# Patient Record
Sex: Female | Born: 1985 | Race: White | Hispanic: No | Marital: Single | Smoking: Former smoker
Health system: Southern US, Community
[De-identification: ages and names within clinical notes are randomized; demographics above are authoritative.]

## PROBLEM LIST (undated history)

## (undated) ENCOUNTER — Inpatient Hospital Stay (HOSPITAL_COMMUNITY): Payer: Self-pay

## (undated) DIAGNOSIS — O24419 Gestational diabetes mellitus in pregnancy, unspecified control: Secondary | ICD-10-CM

## (undated) DIAGNOSIS — B9689 Other specified bacterial agents as the cause of diseases classified elsewhere: Secondary | ICD-10-CM

## (undated) DIAGNOSIS — N760 Acute vaginitis: Secondary | ICD-10-CM

## (undated) DIAGNOSIS — D649 Anemia, unspecified: Secondary | ICD-10-CM

## (undated) DIAGNOSIS — I10 Essential (primary) hypertension: Secondary | ICD-10-CM

## (undated) HISTORY — DX: Other specified bacterial agents as the cause of diseases classified elsewhere: B96.89

## (undated) HISTORY — DX: Other specified bacterial agents as the cause of diseases classified elsewhere: N76.0

## (undated) HISTORY — PX: MOUTH SURGERY: SHX715

## (undated) HISTORY — DX: Essential (primary) hypertension: I10

---
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
## 2006-04-09 ENCOUNTER — Inpatient Hospital Stay (HOSPITAL_COMMUNITY): Admission: AD | Admit: 2006-04-09 | Discharge: 2006-04-09 | Payer: Self-pay | Admitting: *Deleted

## 2006-05-07 ENCOUNTER — Inpatient Hospital Stay (HOSPITAL_COMMUNITY): Admission: AD | Admit: 2006-05-07 | Discharge: 2006-05-07 | Payer: Self-pay | Admitting: Obstetrics & Gynecology

## 2006-06-02 ENCOUNTER — Other Ambulatory Visit: Admission: RE | Admit: 2006-06-02 | Discharge: 2006-06-02 | Payer: Self-pay | Admitting: Obstetrics and Gynecology

## 2006-10-24 ENCOUNTER — Inpatient Hospital Stay (HOSPITAL_COMMUNITY): Admission: AD | Admit: 2006-10-24 | Discharge: 2006-10-24 | Payer: Self-pay | Admitting: Obstetrics and Gynecology

## 2006-10-28 ENCOUNTER — Inpatient Hospital Stay (HOSPITAL_COMMUNITY): Admission: AD | Admit: 2006-10-28 | Discharge: 2006-10-30 | Payer: Self-pay | Admitting: Obstetrics and Gynecology

## 2007-02-19 ENCOUNTER — Emergency Department (HOSPITAL_COMMUNITY): Admission: EM | Admit: 2007-02-19 | Discharge: 2007-02-19 | Payer: Self-pay | Admitting: Emergency Medicine

## 2007-06-03 IMAGING — US US OB COMP +14 WK
1 series · 13 of 28 positions shown · non-contrast
Comparison: none

CLINICAL DATA: 15 week 4 day gestational age by LMP.  Abdominal pain with nausea and vomiting.  Headaches.

[Series 1: us ob comp +14 wk · 0.29mm/px · 13 of 32 slices shown]
[im 2/32]
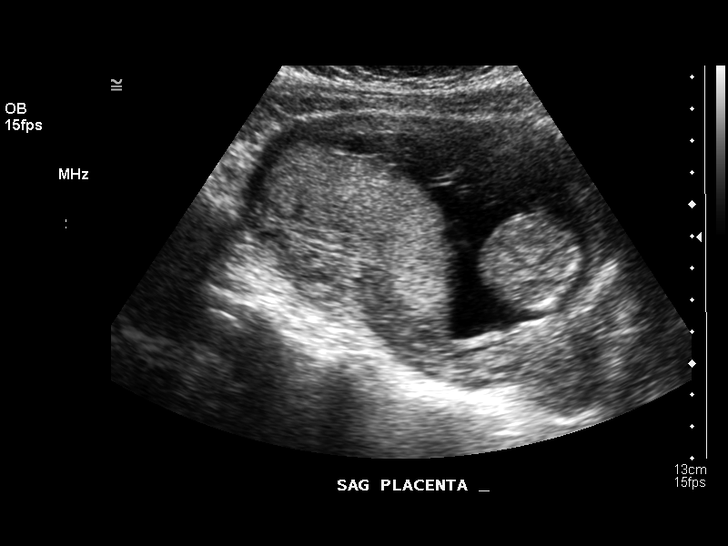
[im 4/32]
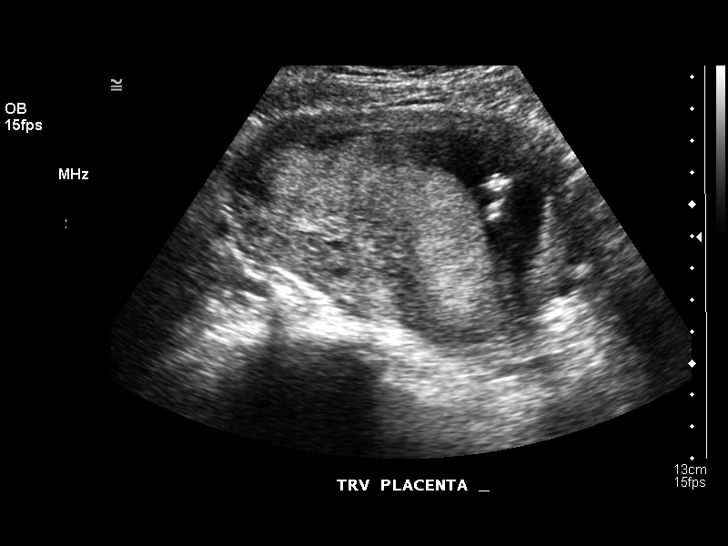
[im 6/32]
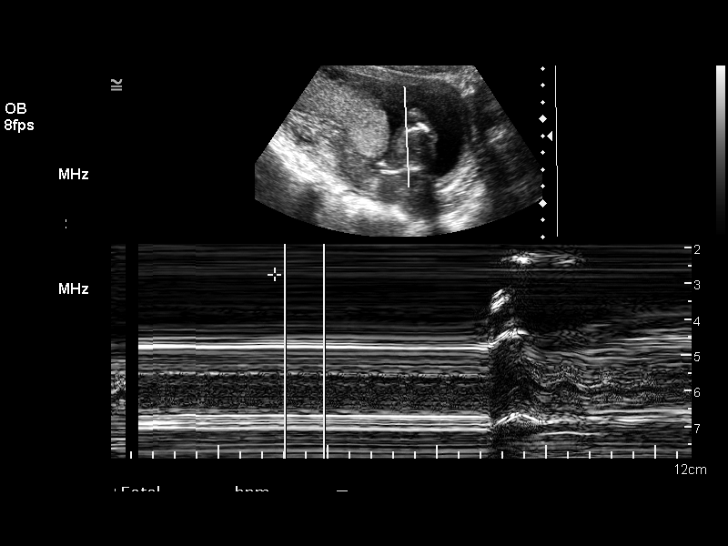
[im 9/32]
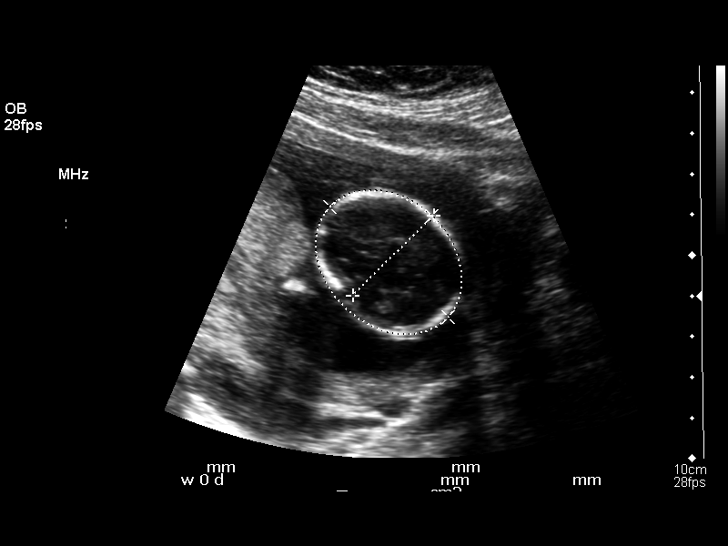
[im 11/32]
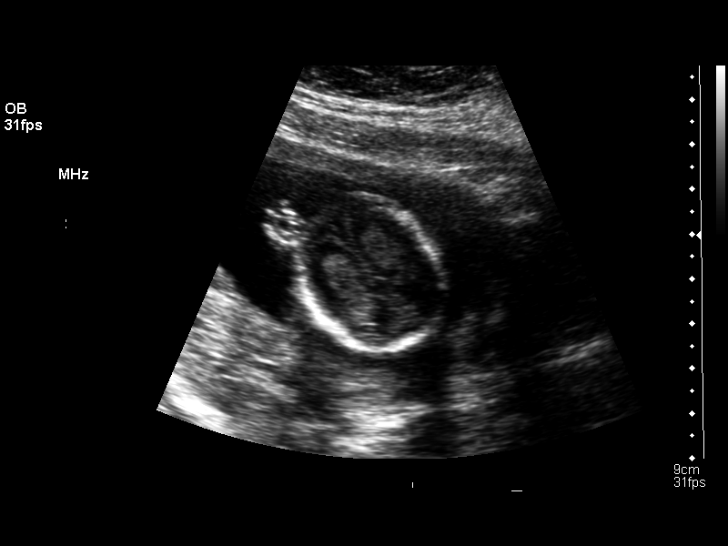
[im 13/32]
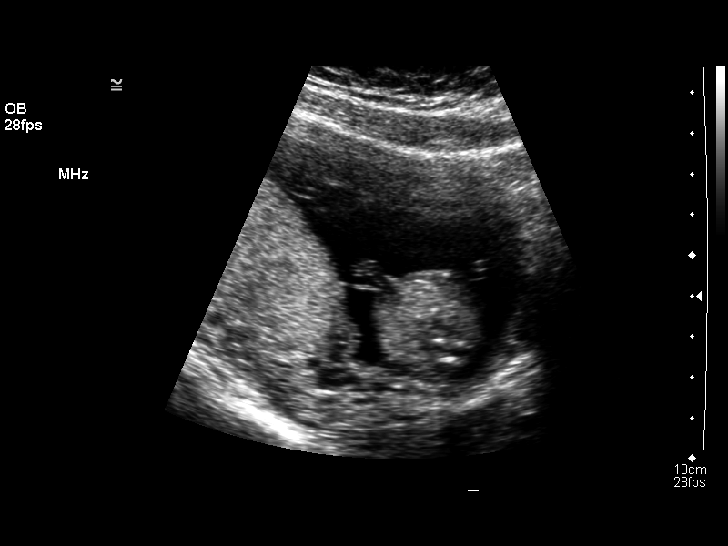
[im 17/32]
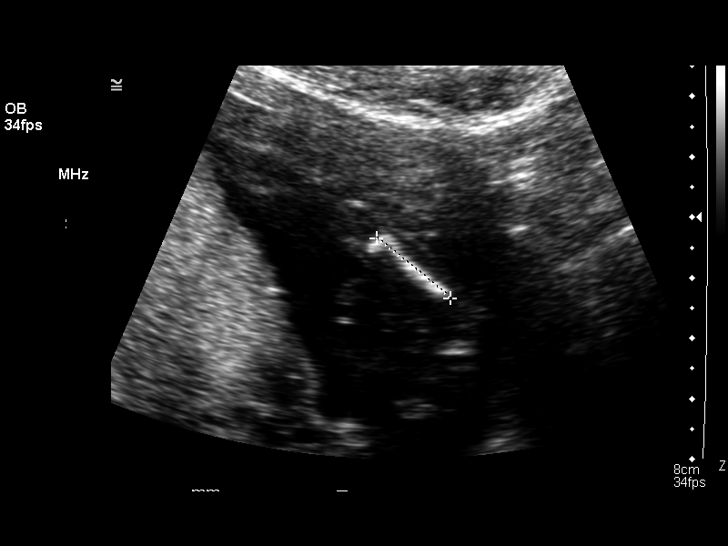
[im 19/32]
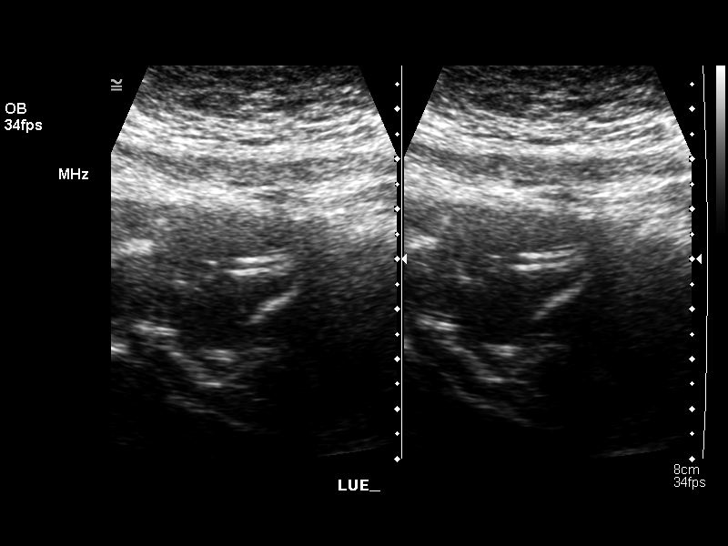
[im 21/32]
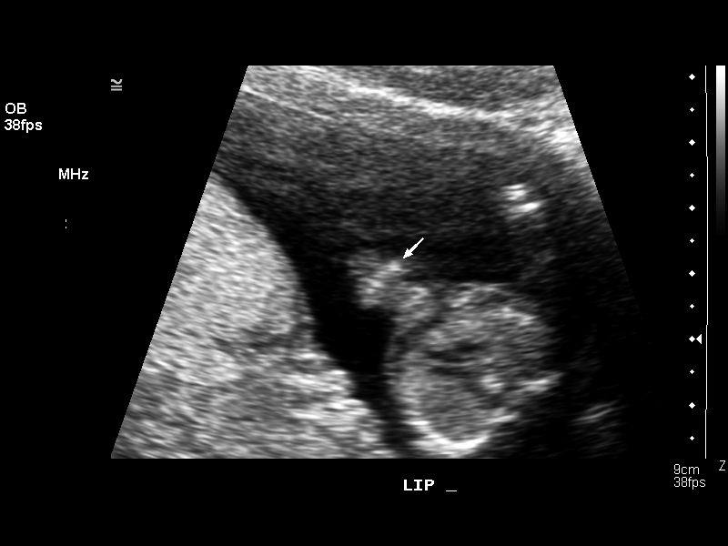
[im 23/32]
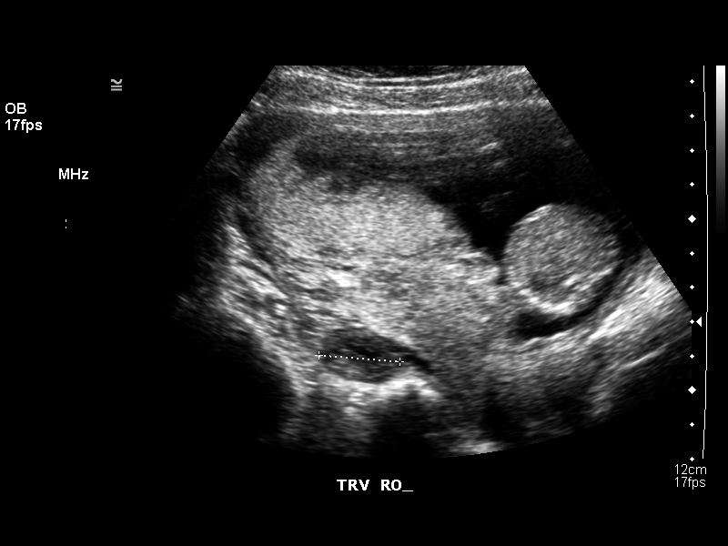
[im 26/32]
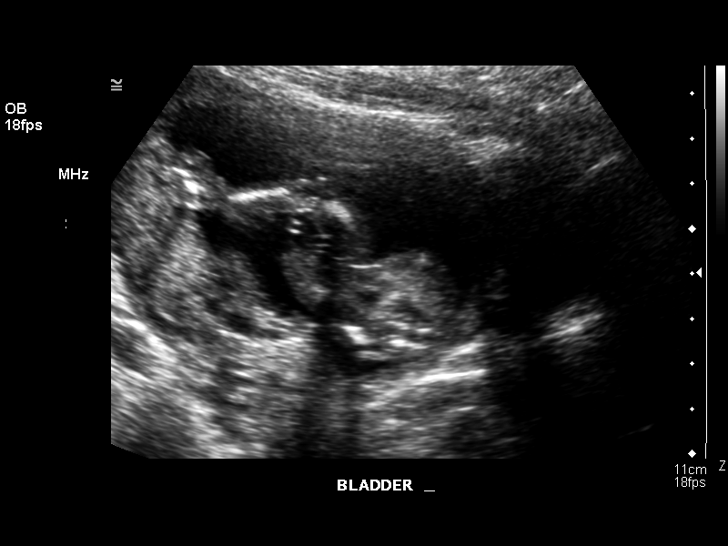
[im 28/32]
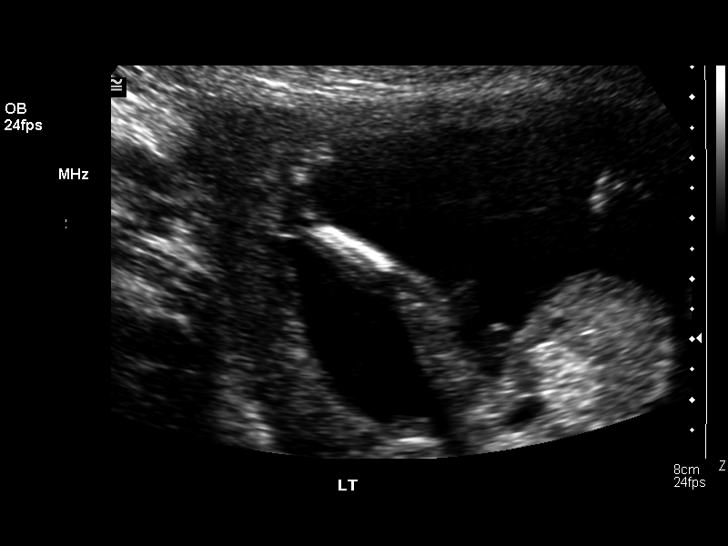
[im 30/32]
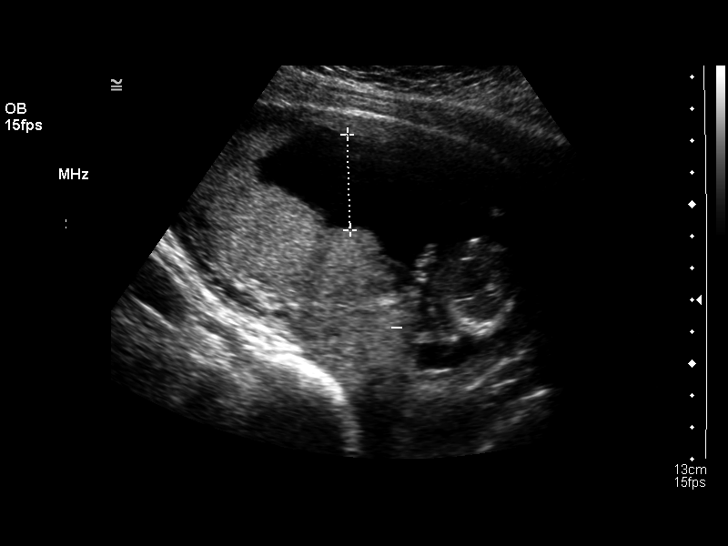

[13 of 28 positions shown; findings below may reference images not displayed]

OBSTETRICAL ULTRASOUND:
 Number of Fetuses: 1
 Heart Rate:  168
 Movement: Yes
 Breathing:  No  
 Presentation:  Transverse with head to maternal right
 Placental Location:  Posterior
 Grade:  0
 Amniotic Fluid (Subjective):  Normal
 Amniotic Fluid (Objective):   3.0 cm Vertical pocket 

 FETAL BIOMETRY
 BPD:   2.9 cm   15 w 2 d
 HC:   11.1 cm   15 w 3 d
 AC:    9.8 cm   15 w 4 d
 FL:    1.6 cm   14 w 4 d

 MEAN GA:  15 w 2 d  US EDC:  10/27/06
 Assigned GA:  15 w 4 d  Assigned EDC:  10/25/06

 FETAL ANATOMY
 Lateral Ventricles:  Choroid plexus visualized  
 Thalami/CSP:      Not visualized 
 Posterior Fossa:  Not visualized 
 Nuchal Region:    Not visualized 
 Spine:      Not visualized 
 4 Chamber Heart on Left:      Not visualized 
 Stomach on Left:      Visualized 
 3 Vessel Cord:    Not visualized 
 Cord Insertion site:    Visualized 
 Kidneys:  Not visualized 
 Bladder:  Visualized 
 Extremities:      Visualized 

 Evaluation limited by:  Early gestational age 

 MATERNAL UTERINE AND ADNEXAL FINDINGS
 Cervix: Closed
IMPRESSION: 1.  Single living intrauterine fetus with assigned age of 15 weeks 4 days by prior office ultrasound.  Appropriate fetal growth.
 2.   No early fetal abnormality identified, although limited by early gestational age.  Follow-up ultrasound is recommended in approximately 3 weeks for complete fetal anatomic evaluation.  
 3.  No maternal uterine or adnexal abnormality identified.

## 2009-02-21 ENCOUNTER — Emergency Department (HOSPITAL_COMMUNITY): Admission: EM | Admit: 2009-02-21 | Discharge: 2009-02-21 | Payer: Self-pay | Admitting: Family Medicine

## 2011-04-10 NOTE — Discharge Summary (Signed)
Megan Tucker, Megan Tucker                 ACCOUNT NO.:  [id number]   MEDICAL RECORD NO.:  [id number]          PATIENT TYPE:  INP   LOCATION:  9144                          FACILITY:  [facility]   PHYSICIAN:  Sherron Monday, MD        DATE OF BIRTH:  1986/10/09   DATE OF ADMISSION:  10/28/2006  DATE OF DISCHARGE:  10/30/2006                               DISCHARGE SUMMARY   ADMISSION DIAGNOSIS:  Intrauterine pregnancy at term, for induction of  labor.   DISCHARGE DIAGNOSIS:  Intrauterine pregnancy at term, for induction of  labor, delivered via spontaneous vaginal delivery.   HISTORY OF PRESENT ILLNESS:  A 25 year old G2, P1-0-0-1, at 40-4/7 weeks  dated by a 12-week ultrasound with an [facility] of October 25, 2006.  Presented for elective induction.  Her prenatal care was relatively  uncomplicated.   PAST MEDICAL HISTORY:  Significant for depression.   PAST SURGICAL HISTORY:  Not significant.   PAST OB/GYN HISTORY:  G1 was a term delivery at 38 weeks, 7 pounds 2  ounces, complicated by gestational diabetes and pregnancy-induced  hypertension.  G2 is the present pregnancy.   ALLERGIES:  PENICILLIN, which causes a rash and shortness of breath.   PRENATAL LABORATORY DATA:  O positive, antibody screen negative.  RPR  nonreactive.  Rubella immune.  Hepatitis B surface antigen negative.  HIV negative.  Gonorrhea and chlamydia negative.  Quad screen was within  normal limits.  Glucola 162 with 3-hour GTT of 95, 144, 136 and 96.  Group B strep was positive.   She was admitted for induction of labor with AROM and started on  Pitocin.  Secondary to her positive group B strep plus her PENICILLIN  allergy, all sensitivities have been checked and she was treated with  Clindamycin.  She progressed to complete-complete and pushed to deliver  a viable female infant with a weight of 8 pounds 2 ounces, Apgars of 9 at  one minute and 9 at five minutes and small lacerations were noted to be  hemostatic and not  repaired.  The EBL was less than 500 mL.  Postpartum  there was some bleeding noted.  She was put on a Methergine protocol,  had no further complications with that.  Her postpartum course was  relatively uncomplicated.  She remained afebrile with stable vital signs  throughout.  Her hemoglobin decreased from 10.6 to 9.0.  She was rubella-  immune.  She was unsure about contraception and will bring that up with  Dr. Jackelyn Knife at her 6-week checkup.  She is discharged with Colace,  iron, prenatal vitamin while breastfeeding and Motrin.      Sherron Monday, MD  Electronically Signed    JB/MEDQ  D:  10/30/2006  T:  10/30/2006  Job:  [id number]

## 2011-04-10 NOTE — Discharge Summary (Signed)
   [name], [name]                             ACCOUNT NO.:  [id number]   MEDICAL RECORD NO.:  [id number]                   PATIENT TYPE:  INP   LOCATION:  9144                                 [facility]ACILITY:  [facility]   PHYSICIAN:  Enid Cutter, M.D.                  DATE OF BIRTH:  August 11, 1986   DATE OF ADMISSION:  05/22/2002  DATE OF DISCHARGE:  05/25/2002                                 DISCHARGE SUMMARY   PRINCIPAL DIAGNOSES:  A 25 year old G1, P0 at 34 weeks and 6 days  intrauterine pregnancy with mild preeclampsia and preterm labor.   ADDITIONAL DIAGNOSES:  None.   HOSPITAL COURSE:  The patient is a 25 year old G1, P0 who presented at 34  weeks and 6 days with history of mild preeclampsia.  She has been followed  earlier and found to have a stable mild preeclampsia.  She came to the  hospital on May 22, 2000 with preterm contractions.  On admission she was  found to be 2-3, 80%, -3 with bulging membranes.  She was admitted.  Her  preeclamptic laboratories were drawn and 24-hour urine was started.  Because  of her mild preeclampsia expectant management was performed.  She had a  history of some elevated blood sugars after her last betamethasone and was  started on a 2200 calorie ADA diet.  On admission she had denied any  neurologic symptoms.  Her platelets and liver function tests were normal.  Spot urinalysis had 100 mg protein.  Creatinine was 0.7 and uric acid was  5.1.  Fetal heart tracing was 130s-140s and reactive without decelerations  and contractions were every two to five minutes.  The patient remained in  the hospital and her contractions waned.  She underwent ultrasound which  revealed normal Dopplers and normal AFI.  Antepartum testing was normal.  She was found to have stable mild preeclampsia and was discharged home on  May 25, 2002.  The patient is to follow up the following Monday for  antepartum testing.  She is discharged home on a good diet.  Discharge  activity  was home rest.  Discharge diet is regular.                                               Enid Cutter, M.D.    EMH/MEDQ  D:  07/02/2002  T:  07/05/2002  Job:  [id number]

## 2011-04-10 NOTE — Discharge Summary (Signed)
[facility]  Patient:    Megan Tucker, Megan Tucker Visit Number: [id number] MRN: [id number]          Service Type: OBS Location: 910D 9124 [id number] Attending Physician:  Amada Kingfisher. Dictated by:   Ed Blalock. Burnadette Peter, M.D. Admit Date:  05/01/2002 Discharge Date: 05/04/2002                             Discharge Summary  DATE OF BIRTH:                   09/07/1986.  HISTORY OF PRESENT ILLNESS:      The patient is a 25 year old G1 presenting for history of no prenatal care because the patients mother was not told about her pregnancy and mother brought the patient in initially with no initial complaints or problems.  The patient did report positional discomfort but no contractions, no vaginal bleeding, no leakage of fluid, positive fetal membranes.  She reported no headaches, normal visual fields with no symptoms. Review of systems was negative.  She had an occasional mild cramp and reports occasional mild headaches but none at this time.  She had no prenatal care. Pregnancy complicated by this late presentation.  MEDICATIONS:                     None.  ALLERGIES:                       PENICILLIN with a rash related to that.  OBSTETRICAL HISTORY:             No previous.  GYNECOLOGICAL HISTORY:           Menarche at 63-64 years old.  MEDICAL HISTORY:                 None.  SURGICAL HISTORY:                None.  FAMILY AND SOCIAL HISTORY:       Denied alcohol, illicit drug use, or tobacco. Entering 11th grade.  Lives with parents.  The patients mothers history is significant for a history of miscarriage and C-section and gestational diabetes.  PHYSICAL EXAMINATION:            On presentation vital signs were stable with the exception of blood pressure which was 131/88 and 131/92.  Generally well-developed, well-nourished female in no acute distress.  She was slightly tachycardic.  Lungs were clear to auscultation.  Abdomen 29 cm fundal  height. Extremities no edema, normal pulses, and no ecchymoses.  Neurological 2+ deep tendon reflexes.  Speculum exam white discharge, friable cervix.  Digital mild anterior cervix, externally 1.  Fetal heart rate was 140 with good variability and reactivity.  Toco showed no contractions but questionable uterine irritability.  Ultrasound showed fetus to be 32-0/7 cephalic, AFI 18.1, cervix of 2.4.  LABORATORY DATA:                 UA showed 30 protein, 0.1030 specific gravity.  HOSPITAL COURSE:                 The patient was started on antibiotics for treatment of possible preterm labor and cervicitis.  Her GC and Chlamydia were negative but GBS was positive.  Because of her elevated blood pressures, PIH panel was done which was significant only for a uric acid of 6.6 which  trended downward on repeat serial exam to 4.4.  A 24-hour urine was done which showed total protein of 375 diagnostic of preeclampsia.  Throughout the hospitalization the patient had no symptoms including headache, right upper quadrant pain, visual field defects or preeclampsia.  Urine was positive for E. coli which was pan-sensitive so she was continued on Cefotan antibiotics for this as well as her cervicitis with moderate wbcs.  She also received Diflucan x1 for yeast.  She had moderate yeast on the wet prep.  PROBLEM LIST:                    #1 - PREECLAMPSIA:  Mild diagnosis by elevations in blood pressure and 24-hour urine.  The patient was asymptomatic otherwise with this with normal labs with the exception of uric acid which trended towards normal.  It was felt that this was stable and she could be discharged home with routine followup for this.                                   #2 - URINARY TRACT INFECTION:  Covered.  She received 4 days of inpatient antibiotics, therefore, she was not sent home on antibiotic for this.                                   #3 - PRETERM CONTRACTIONS/PRETERM LABOR:   The patients cervix did change during the hospitalization from fingertip external to 1 all the way through with the fetal head palpable and ballottable and developed lower uterine segment.  After serial checks this remained stable. The patient was initially tocolyzed with Procardia but after the diagnosis of preeclampsia this was discontinued.  She was also on p.o. p.r.n. terbutaline and this was discontinued as well.                                   #4 - NO PRENATAL CARE:  The patient was seen by social work and evaluated.  She was referred to the [facility].  She had prenatal labs done which as noted above were normal.  She received a glucola on admission which was 124.  DISCHARGE DIET:                  Regular.  DISCHARGE ACTIVITY:              Bed rest and pelvic rest.  DISCHARGE MEDICATIONS:           1. Prenatal vitamins.                                  2. Iron.                                  3. Colace.  DISCHARGE CONDITION:             Improved.  DISCHARGE DIAGNOSES:             1. Intrauterine pregnancy at 32-plus weeks.  2. Preeclampsia mild.                                  3. Urinary tract infection.                                  4. Preterm labor.                                  5. Yeast infection.  DISCHARGE FOLLOWUP:              She is to be seen at maternity admissions unit Tuesday, May 09, 2002 at 1 p.m.  She is to get twice weekly testing for [facility] protocol. Dictated by:   Ed Blalock. Burnadette Peter, M.D. Attending Physician:  Amada Kingfisher. DD:  05/04/02 TD:  05/06/02 Job: [id number]

## 2012-06-27 ENCOUNTER — Encounter (HOSPITAL_COMMUNITY): Payer: Self-pay | Admitting: *Deleted

## 2012-06-27 ENCOUNTER — Inpatient Hospital Stay (HOSPITAL_COMMUNITY)
Admission: AD | Admit: 2012-06-27 | Discharge: 2012-06-27 | Disposition: A | Discharge status: Home / Self Care | Payer: Medicaid Other | Source: Ambulatory Visit | Attending: Obstetrics and Gynecology | Admitting: Obstetrics and Gynecology

## 2012-06-27 DIAGNOSIS — O2390 Unspecified genitourinary tract infection in pregnancy, unspecified trimester: Secondary | ICD-10-CM | POA: Insufficient documentation

## 2012-06-27 DIAGNOSIS — A499 Bacterial infection, unspecified: Secondary | ICD-10-CM | POA: Insufficient documentation

## 2012-06-27 DIAGNOSIS — N949 Unspecified condition associated with female genital organs and menstrual cycle: Secondary | ICD-10-CM | POA: Insufficient documentation

## 2012-06-27 DIAGNOSIS — B9689 Other specified bacterial agents as the cause of diseases classified elsewhere: Secondary | ICD-10-CM | POA: Insufficient documentation

## 2012-06-27 DIAGNOSIS — N760 Acute vaginitis: Secondary | ICD-10-CM

## 2012-06-27 HISTORY — DX: Anemia, unspecified: D64.9

## 2012-06-27 HISTORY — DX: Gestational diabetes mellitus in pregnancy, unspecified control: O24.419

## 2012-06-27 LAB — WET PREP, GENITAL
Trich, Wet Prep: NONE SEEN
Yeast Wet Prep HPF POC: NEGATIVE — AB

## 2012-06-27 LAB — URINALYSIS, ROUTINE W REFLEX MICROSCOPIC
Glucose, UA: NEGATIVE mg/dL
Ketones, ur: NEGATIVE mg/dL
Leukocytes, UA: NEGATIVE
Nitrite: NEGATIVE
Protein, ur: NEGATIVE mg/dL
Urobilinogen, UA: 0.2 mg/dL (ref 0.0–1.0)

## 2012-06-27 MED ORDER — METRONIDAZOLE 500 MG PO TABS: 500.0000 mg | ORAL_TABLET | Freq: Two times a day (BID) | ORAL | Status: AC

## 2012-06-27 NOTE — MAU Note (Signed)
Pt reports vaginal discharge with odor. Pt reports she is [redacted] weeks pregnant and has an appointment with Nestor Ramp in a week. occassional abd pain.

## 2012-06-27 NOTE — MAU Provider Note (Signed)
History     CSN: [id number]  Arrival date and time: 06/27/12 4098   First Provider Initiated Contact with Patient 06/27/12 2135      Chief Complaint  Patient presents with  . Vaginal Discharge   HPI  Pt reports white vaginal discharge with odor. Pt reports she is [redacted] weeks pregnant and has an appointment with Nestor Ramp in a week.  Occassional abd pain, no vaginal bleeding.     Past Medical History  Diagnosis Date  . Anemia   . Gestational diabetes     History reviewed. No pertinent past surgical history.  History reviewed. No pertinent family history.  History  Substance Use Topics  . Smoking status: Former Games developer  . Smokeless tobacco: Not on file  . Alcohol Use: No    Allergies:  Allergies  Allergen Reactions  . Penicillins Other (See Comments)    Reaction unknown, parent has allergy  . Adhesive (Tape) Rash    Prescriptions prior to admission  Medication Sig Dispense Refill  . acetaminophen (TYLENOL) 500 MG tablet Take 500 mg by mouth every 6 (six) hours as needed. For pain      . ibuprofen (ADVIL,MOTRIN) 200 MG tablet Take 200 mg by mouth every 6 (six) hours as needed. For pain      . Prenatal Vit-Fe Fumarate-FA (PRENATAL MULTIVITAMIN) TABS Take 1 tablet by mouth daily.        Review of Systems  Constitutional: Negative.   Respiratory: Negative.   Cardiovascular: Negative.   Gastrointestinal: Positive for abdominal pain (cramping).  Genitourinary: Negative for dysuria, urgency, frequency, hematuria and flank pain.       +white vaginal discharge with odor   Physical Exam   Blood pressure 116/76, pulse 85, temperature 98.8 F (37.1 C), temperature source Oral, resp. rate 16, height 5\' 4"  (1.626 m), weight 145 lb (65.772 kg), last menstrual period 03/26/2012, SpO2 100.00%.  Physical Exam  Constitutional: She is oriented to person, place, and time. She appears well-developed and well-nourished. No distress.  HENT:  Head: Normocephalic.  Neck: Normal  range of motion. Neck supple.  Cardiovascular: Normal rate, regular rhythm and normal heart sounds.  Exam reveals no gallop and no friction rub.   No murmur heard. Respiratory: Effort normal and breath sounds normal. No respiratory distress.  GI: She exhibits no mass. There is no tenderness. There is no rebound, no guarding and no CVA tenderness.  Genitourinary: Uterus is enlarged. Cervix exhibits no motion tenderness and no discharge. Vaginal discharge (white, creamy) found.  Musculoskeletal: Normal range of motion.  Neurological: She is alert and oriented to person, place, and time.  Skin: Skin is warm and dry.  Psychiatric: She has a normal mood and affect.    MAU Course  Procedures  Results for orders placed during the hospital encounter of 06/27/12 (from the past 24 hour(s))  URINALYSIS, ROUTINE W REFLEX MICROSCOPIC     Status: Abnormal   Collection Time   06/27/12  7:35 PM      Component Value Range   Color, Urine YELLOW  YELLOW   APPearance HAZY (*) CLEAR   Specific Gravity, Urine >1.030 (*) 1.005 - 1.030   pH 6.0  5.0 - 8.0   Glucose, UA NEGATIVE  NEGATIVE mg/dL   Hgb urine dipstick NEGATIVE  NEGATIVE   Bilirubin Urine NEGATIVE  NEGATIVE   Ketones, ur NEGATIVE  NEGATIVE mg/dL   Protein, ur NEGATIVE  NEGATIVE mg/dL   Urobilinogen, UA 0.2  0.0 - 1.0 mg/dL  Nitrite NEGATIVE  NEGATIVE   Leukocytes, UA NEGATIVE  NEGATIVE  WET PREP, GENITAL     Status: Abnormal   Collection Time   06/27/12  9:40 PM      Component Value Range   Yeast Wet Prep HPF POC NEGATIVE (*) NONE SEEN   Trich, Wet Prep NONE SEEN  NONE SEEN   Clue Cells Wet Prep HPF POC FEW (*) NONE SEEN   WBC, Wet Prep HPF POC FEW (*) NONE SEEN     Assessment and Plan  Bacterial Vaginosis  Plan: DC to home RX Flagyl Keep scheduled appointment  [facility] 06/27/2012, 9:36 PM

## 2012-07-05 ENCOUNTER — Other Ambulatory Visit: Payer: Self-pay | Admitting: Obstetrics and Gynecology

## 2012-07-05 LAB — OB RESULTS CONSOLE HEPATITIS B SURFACE ANTIGEN: Hepatitis B Surface Ag: NEGATIVE

## 2012-07-05 LAB — OB RESULTS CONSOLE RUBELLA ANTIBODY, IGM: Rubella: IMMUNE

## 2012-07-05 LAB — OB RESULTS CONSOLE ABO/RH: RH Type: POSITIVE

## 2012-07-05 LAB — OB RESULTS CONSOLE RPR: RPR: NONREACTIVE

## 2012-11-23 HISTORY — PX: TUBAL LIGATION: SHX77

## 2012-11-23 NOTE — L&D Delivery Note (Signed)
Patient was C/C/+3 and pushed for <15 minutes with epidural.   NSVD female infant, Apgars 8/9, weight pending.   The patient had one tiny vaginal wall tear on Let repaired with 3-0 vicryl figure of eight. Fundus was firm. EBL was expected. Placenta was delivered intact. Vagina was clear.  Baby was vigorous to bedside.  Philip Aspen

## 2012-12-24 ENCOUNTER — Encounter (HOSPITAL_COMMUNITY): Payer: Self-pay | Admitting: *Deleted

## 2012-12-24 ENCOUNTER — Inpatient Hospital Stay (HOSPITAL_COMMUNITY)
Admission: AD | Admit: 2012-12-24 | Discharge: 2012-12-27 | DRG: 775 | Disposition: A | Discharge status: Home / Self Care | Payer: Medicaid Other | Source: Ambulatory Visit | Attending: Obstetrics and Gynecology | Admitting: Obstetrics and Gynecology

## 2012-12-24 NOTE — MAU Note (Signed)
Contractions since 1818. Denies bleeding or leaking

## 2012-12-25 ENCOUNTER — Encounter (HOSPITAL_COMMUNITY): Payer: Self-pay | Admitting: *Deleted

## 2012-12-25 ENCOUNTER — Encounter (HOSPITAL_COMMUNITY): Payer: Self-pay | Admitting: Anesthesiology

## 2012-12-25 ENCOUNTER — Inpatient Hospital Stay (HOSPITAL_COMMUNITY): Payer: Medicaid Other | Admitting: Anesthesiology

## 2012-12-25 LAB — RPR: RPR Ser Ql: NONREACTIVE

## 2012-12-25 LAB — CBC
MCH: 25.7 pg — ABNORMAL LOW (ref 26.0–34.0)
MCHC: 32.6 g/dL (ref 30.0–36.0)
Platelets: 322 10*3/uL (ref 150–400)
RBC: 3.93 MIL/uL (ref 3.87–5.11)
RDW: 15.4 % (ref 11.5–15.5)

## 2012-12-25 MED ORDER — OXYCODONE-ACETAMINOPHEN 5-325 MG PO TABS: 1.0000 | ORAL_TABLET | ORAL | Status: DC | PRN

## 2012-12-25 MED ORDER — DIBUCAINE 1 % RE OINT: 1.0000 "application " | TOPICAL_OINTMENT | RECTAL | Status: DC | PRN

## 2012-12-25 MED ORDER — CITRIC ACID-SODIUM CITRATE 334-500 MG/5ML PO SOLN: 30.0000 mL | ORAL | Status: DC | PRN

## 2012-12-25 MED ORDER — LIDOCAINE HCL (PF) 1 % IJ SOLN
INTRAMUSCULAR | Status: DC | PRN
  Administered 2012-12-25 (×2): 5 mL

## 2012-12-25 MED ORDER — PHENYLEPHRINE 40 MCG/ML (10ML) SYRINGE FOR IV PUSH (FOR BLOOD PRESSURE SUPPORT): 80.0000 ug | PREFILLED_SYRINGE | INTRAVENOUS | Status: DC | PRN

## 2012-12-25 MED ORDER — ONDANSETRON HCL 4 MG/2ML IJ SOLN: 4.0000 mg | INTRAMUSCULAR | Status: DC | PRN

## 2012-12-25 MED ORDER — IBUPROFEN 600 MG PO TABS
600.0000 mg | ORAL_TABLET | Freq: Four times a day (QID) | ORAL | Status: DC
  Administered 2012-12-25 – 2012-12-27 (×8): 600 mg via ORAL
  Filled 2012-12-25 (×8): qty 1

## 2012-12-25 MED ORDER — PRENATAL MULTIVITAMIN CH: 1.0000 | ORAL_TABLET | Freq: Every day | ORAL | Status: DC

## 2012-12-25 MED ORDER — SENNOSIDES-DOCUSATE SODIUM 8.6-50 MG PO TABS
2.0000 | ORAL_TABLET | Freq: Every day | ORAL | Status: DC
  Administered 2012-12-25 – 2012-12-26 (×2): 2 via ORAL

## 2012-12-25 MED ORDER — LIDOCAINE HCL (PF) 1 % IJ SOLN
30.0000 mL | INTRAMUSCULAR | Status: DC | PRN
  Filled 2012-12-25: qty 30

## 2012-12-25 MED ORDER — PRENATAL MULTIVITAMIN CH
1.0000 | ORAL_TABLET | Freq: Every day | ORAL | Status: DC
  Administered 2012-12-25 – 2012-12-27 (×3): 1 via ORAL
  Filled 2012-12-25 (×3): qty 1

## 2012-12-25 MED ORDER — ONDANSETRON HCL 4 MG PO TABS: 4.0000 mg | ORAL_TABLET | ORAL | Status: DC | PRN

## 2012-12-25 MED ORDER — LACTATED RINGERS IV SOLN: 500.0000 mL | INTRAVENOUS | Status: DC | PRN

## 2012-12-25 MED ORDER — LACTATED RINGERS IV SOLN
500.0000 mL | Freq: Once | INTRAVENOUS | Status: AC
  Administered 2012-12-25: 500 mL via INTRAVENOUS

## 2012-12-25 MED ORDER — EPHEDRINE 5 MG/ML INJ
10.0000 mg | INTRAVENOUS | Status: DC | PRN
  Filled 2012-12-25: qty 4

## 2012-12-25 MED ORDER — DIPHENHYDRAMINE HCL 25 MG PO CAPS: 25.0000 mg | ORAL_CAPSULE | Freq: Four times a day (QID) | ORAL | Status: DC | PRN

## 2012-12-25 MED ORDER — FENTANYL 2.5 MCG/ML BUPIVACAINE 1/10 % EPIDURAL INFUSION (WH - ANES)
14.0000 mL/h | INTRAMUSCULAR | Status: DC
  Administered 2012-12-25: 14 mL/h via EPIDURAL
  Filled 2012-12-25: qty 125

## 2012-12-25 MED ORDER — OXYTOCIN BOLUS FROM INFUSION
500.0000 mL | INTRAVENOUS | Status: DC
  Administered 2012-12-25: 500 mL via INTRAVENOUS

## 2012-12-25 MED ORDER — WITCH HAZEL-GLYCERIN EX PADS: 1.0000 "application " | MEDICATED_PAD | CUTANEOUS | Status: DC | PRN

## 2012-12-25 MED ORDER — BENZOCAINE-MENTHOL 20-0.5 % EX AERO
1.0000 "application " | INHALATION_SPRAY | CUTANEOUS | Status: DC | PRN
  Filled 2012-12-25: qty 56

## 2012-12-25 MED ORDER — SIMETHICONE 80 MG PO CHEW: 80.0000 mg | CHEWABLE_TABLET | ORAL | Status: DC | PRN

## 2012-12-25 MED ORDER — ZOLPIDEM TARTRATE 5 MG PO TABS: 5.0000 mg | ORAL_TABLET | Freq: Every evening | ORAL | Status: DC | PRN

## 2012-12-25 MED ORDER — OXYTOCIN 40 UNITS IN LACTATED RINGERS INFUSION - SIMPLE MED
62.5000 mL/h | INTRAVENOUS | Status: DC
  Filled 2012-12-25: qty 1000

## 2012-12-25 MED ORDER — DIPHENHYDRAMINE HCL 50 MG/ML IJ SOLN: 12.5000 mg | INTRAMUSCULAR | Status: DC | PRN

## 2012-12-25 MED ORDER — PHENYLEPHRINE 40 MCG/ML (10ML) SYRINGE FOR IV PUSH (FOR BLOOD PRESSURE SUPPORT)
80.0000 ug | PREFILLED_SYRINGE | INTRAVENOUS | Status: DC | PRN
  Filled 2012-12-25: qty 5

## 2012-12-25 MED ORDER — IBUPROFEN 600 MG PO TABS: 600.0000 mg | ORAL_TABLET | Freq: Four times a day (QID) | ORAL | Status: DC | PRN

## 2012-12-25 MED ORDER — LACTATED RINGERS IV SOLN
INTRAVENOUS | Status: DC
  Administered 2012-12-25: 01:00:00 via INTRAVENOUS

## 2012-12-25 MED ORDER — ONDANSETRON HCL 4 MG/2ML IJ SOLN: 4.0000 mg | Freq: Four times a day (QID) | INTRAMUSCULAR | Status: DC | PRN

## 2012-12-25 MED ORDER — LANOLIN HYDROUS EX OINT: TOPICAL_OINTMENT | CUTANEOUS | Status: DC | PRN

## 2012-12-25 MED ORDER — TETANUS-DIPHTH-ACELL PERTUSSIS 5-2.5-18.5 LF-MCG/0.5 IM SUSP: 0.5000 mL | Freq: Once | INTRAMUSCULAR | Status: DC

## 2012-12-25 MED ORDER — EPHEDRINE 5 MG/ML INJ: 10.0000 mg | INTRAVENOUS | Status: DC | PRN

## 2012-12-25 MED ORDER — ACETAMINOPHEN 325 MG PO TABS: 650.0000 mg | ORAL_TABLET | ORAL | Status: DC | PRN

## 2012-12-25 NOTE — Progress Notes (Addendum)
Of note, RN was about to call me for delivery when head precipitously delivered.  Midwife outside door stepped in to assist with delivery of shoulders.  No problems noted. I arrived immediately thereafter.  Pt desires tubal, consent signed, however pt states she was told at the office that due to late signing of paperwork, the earliest she could have to tubal was 2/14 or 2/16.  Told pt to call office Monday to discuss set up.

## 2012-12-25 NOTE — Anesthesia Postprocedure Evaluation (Signed)
Anesthesia Post Note  Patient: Megan Tucker  Procedure(s) Performed: * No procedures listed *  Anesthesia type: Epidural  Patient location: Mother/Baby  Post pain: Pain level controlled  Post assessment: Post-op Vital signs reviewed  Last Vitals:  Filed Vitals:   12/25/12 1358  BP: 116/79  Pulse: 91  Temp: 37 C  Resp: 20    Post vital signs: Reviewed  Level of consciousness:alert  Complications: No apparent anesthesia complications

## 2012-12-25 NOTE — Anesthesia Procedure Notes (Signed)
Epidural Patient location during procedure: OB Start time: 12/25/2012 3:35 AM  Staffing Anesthesiologist: Angus Seller., Harrell Gave. Performed by: anesthesiologist   Preanesthetic Checklist Completed: patient identified, site marked, surgical consent, pre-op evaluation, timeout performed, IV checked, risks and benefits discussed and monitors and equipment checked  Epidural Patient position: sitting Prep: site prepped and draped and DuraPrep Patient monitoring: continuous pulse ox and blood pressure Approach: midline Injection technique: LOR air  Needle:  Needle type: Tuohy  Needle gauge: 17 G Needle length: 9 cm and 9 Needle insertion depth: 5 cm cm Catheter type: closed end flexible Catheter size: 19 Gauge Catheter at skin depth: 10 cm Test dose: negative  Assessment Events: blood not aspirated, injection not painful, no injection resistance, negative IV test and no paresthesia  Additional Notes Patient identified.  Risk benefits discussed including failed block, incomplete pain control, headache, nerve damage, paralysis, blood pressure changes, nausea, vomiting, reactions to medication both toxic or allergic, and postpartum back pain.  Patient expressed understanding and wished to proceed.  All questions were answered.  Sterile technique used throughout procedure and epidural site dressed with sterile barrier dressing. No paresthesia or other complications noted.The patient did not experience any signs of intravascular injection such as tinnitus or metallic taste in mouth nor signs of intrathecal spread such as rapid motor block. Please see nursing notes for vital signs.

## 2012-12-25 NOTE — Anesthesia Preprocedure Evaluation (Signed)
Anesthesia Evaluation  Patient identified by MRN, date of birth, ID band Patient awake    Reviewed: Allergy & Precautions, H&P , Patient's Chart, lab work & pertinent test results  Airway Mallampati: II TM Distance: >3 FB Neck ROM: full    Dental No notable dental hx.    Pulmonary neg pulmonary ROS,  breath sounds clear to auscultation  Pulmonary exam normal       Cardiovascular negative cardio ROS  Rhythm:regular Rate:Normal     Neuro/Psych negative neurological ROS  negative psych ROS   GI/Hepatic negative GI ROS, Neg liver ROS,   Endo/Other  negative endocrine ROSdiabetes  Renal/GU negative Renal ROS     Musculoskeletal   Abdominal   Peds  Hematology negative hematology ROS (+) anemia ,   Anesthesia Other Findings   Reproductive/Obstetrics (+) Pregnancy                           Anesthesia Physical Anesthesia Plan  ASA: III  Anesthesia Plan: Epidural   Post-op Pain Management:    Induction:   Airway Management Planned:   Additional Equipment:   Intra-op Plan:   Post-operative Plan:   Informed Consent: I have reviewed the patients History and Physical, chart, labs and discussed the procedure including the risks, benefits and alternatives for the proposed anesthesia with the patient or authorized representative who has indicated his/her understanding and acceptance.     Plan Discussed with:   Anesthesia Plan Comments:         Anesthesia Quick Evaluation

## 2012-12-25 NOTE — H&P (Signed)
27 y.o. [redacted]w[redacted]d  G3P2002 comes in c/o ctx.  Otherwise has good fetal movement and no bleeding.  Past Medical History  Diagnosis Date  . Anemia   . Gestational diabetes     Past Surgical History  Procedure Date  . No past surgeries     OB History    Grav Para Term Preterm Abortions TAB SAB Ect Mult Living   3 2 2  0 0 0 0 0 0 2     # Outc Date GA Lbr Len/2nd Wgt Sex Del Anes PTL Lv   1 TRM 7/03   7lb2oz(3.232kg) M SVD   Yes   2 TRM 12/07   8lb2oz(3.685kg) M SVD   Yes   3 CUR               History   Social History  . Marital Status: Single    Spouse Name: N/A    Number of Children: N/A  . Years of Education: N/A   Occupational History  . Not on file.   Social History Main Topics  . Smoking status: Former Games developer  . Smokeless tobacco: Not on file  . Alcohol Use: No  . Drug Use: No  . Sexually Active: Yes   Other Topics Concern  . Not on file   Social History Narrative  . No narrative on file   Adhesive    Prenatal Transfer Tool  Maternal Diabetes: No Genetic Screening: Normal Fetal Ultrasounds or other Referrals:  Other: normal Maternal Substance Abuse:  No Significant Maternal Medications:  None Significant Maternal Lab Results: Lab values include: Group B Strep negative  Other PNC:    Filed Vitals:   12/25/12 0406  BP: 102/70  Pulse: 92  Temp:   Resp:      Lungs/Cor:  NAD Abdomen:  soft, gravid Ex:  no cords, erythema SVE:  5.5/80/0 FHTs:  125, good STV, NST R Toco:  q 3-4   A/P   Admit in labor  Epidural received  Routine care  GBS Neg AROM now if possible.  Philip Aspen

## 2012-12-26 LAB — CBC
HCT: 30.1 % — ABNORMAL LOW (ref 36.0–46.0)
RDW: 15.6 % — ABNORMAL HIGH (ref 11.5–15.5)
WBC: 11.6 10*3/uL — ABNORMAL HIGH (ref 4.0–10.5)

## 2012-12-26 NOTE — Progress Notes (Signed)
UR chart review completed.  

## 2012-12-26 NOTE — Progress Notes (Signed)
Post Partum Day 1 Subjective: no complaints  Objective: Blood pressure 104/67, pulse 84, temperature 98.6 F (37 C), temperature source Oral, resp. rate 18, height 5\' 4"  (1.626 m), weight 77.293 kg (170 lb 6.4 oz), last menstrual period 03/26/2012, SpO2 98.00%, unknown if currently breastfeeding.  Physical Exam:  General: alert Lochia: appropriate Uterine Fundus: firm   Basename 12/26/12 0525 12/25/12 0105  HGB 9.7* 10.1*  HCT 30.1* 31.0*    Assessment/Plan: Plan for discharge tomorrow, baby not ready for early discharge.   LOS: 2 days   Mickel Baas 12/26/2012, 9:38 AM

## 2012-12-26 NOTE — Progress Notes (Signed)
Patient was referred for history of depression/anxiety.  * Referral screened out by Clinical Social Worker because none of the following criteria appear to apply:  ~ History of anxiety/depression during this pregnancy, or of post-partum depression.  ~ Diagnosis of anxiety and/or depression within last 3 years  ~ History of depression due to pregnancy loss/loss of child  OR  * Patient's symptoms currently being treated with medication and/or therapy.  Please contact the Clinical Social Worker if needs arise, or by the patient's request.  Pt denies PP depression, as her symptoms lasted one day. She denies any history of depression and reports feeling fine now. Pt has good support.

## 2012-12-27 NOTE — Discharge Summary (Signed)
Obstetric Discharge Summary Reason for Admission: onset of labor Prenatal Procedures: none Intrapartum Procedures: spontaneous vaginal delivery Postpartum Procedures: none Complications-Operative and Postpartum: none Hemoglobin  Date Value Range Status  12/26/2012 9.7* 12.0 - 15.0 g/dL Final     HCT  Date Value Range Status  12/26/2012 30.1* 36.0 - 46.0 % Final    Discharge Diagnoses: Term Pregnancy-delivered  Discharge Information: Date: 12/27/2012 Activity: pelvic rest Diet: routine Medications: Ibuprofen and Iron Condition: stable Instructions: refer to practice specific booklet Discharge to: home Follow-up Information    Follow up with CALLAHAN, SIDNEY, DO. Schedule an appointment as soon as possible for a visit in 4 weeks.   Contact information:   [address] [place] [address] [phone]          Newborn Data: Live born female  Birth Weight: 8 lb 9 oz (3885 g) APGAR: 8, 9  Home with mother.  Naim Murtha A 12/27/2012, 7:59 AM

## 2012-12-27 NOTE — Progress Notes (Signed)
Patient is eating, ambulating, voiding.  Pain control is good.  Filed Vitals:   12/26/12 0619 12/26/12 1418 12/26/12 2212 12/27/12 0535  BP: 104/67 120/78 105/66 106/70  Pulse: 84 80 74 93  Temp: 98.6 F (37 C) 97.4 F (36.3 C) 98.2 F (36.8 C) 98.6 F (37 C)  TempSrc: Oral Oral Oral Oral  Resp: 18 18 18 18   Height:      Weight:      SpO2:  99%      Fundus firm Perineum without swelling.  Lab Results  Component Value Date   WBC 11.6* 12/26/2012   HGB 9.7* 12/26/2012   HCT 30.1* 12/26/2012   MCV 78.8 12/26/2012   PLT 285 12/26/2012    O/Positive/-- (08/13 0000)/RI  A/P Post partum day 2.  Routine care.  Expect d/c today.  Iron at home.  Lacey Dotson A

## 2014-09-24 ENCOUNTER — Encounter (HOSPITAL_COMMUNITY): Payer: Self-pay | Admitting: *Deleted

## 2015-08-12 ENCOUNTER — Ambulatory Visit: Payer: Medicaid Other | Admitting: Primary Care

## 2015-08-15 ENCOUNTER — Ambulatory Visit: Payer: Medicaid Other | Admitting: Primary Care

## 2015-08-23 ENCOUNTER — Ambulatory Visit: Payer: Medicaid Other | Admitting: Primary Care

## 2016-05-21 ENCOUNTER — Ambulatory Visit (INDEPENDENT_AMBULATORY_CARE_PROVIDER_SITE_OTHER): Payer: 59 | Admitting: Family Medicine

## 2016-05-21 ENCOUNTER — Encounter: Payer: Self-pay | Admitting: Family Medicine

## 2016-05-21 VITALS — BP 126/84 | HR 93 | Temp 98.5°F | Ht 64.0 in | Wt 163.0 lb

## 2016-05-21 DIAGNOSIS — Z8632 Personal history of gestational diabetes: Secondary | ICD-10-CM

## 2016-05-21 DIAGNOSIS — R41840 Attention and concentration deficit: Secondary | ICD-10-CM

## 2016-05-21 DIAGNOSIS — Z0001 Encounter for general adult medical examination with abnormal findings: Secondary | ICD-10-CM

## 2016-05-21 DIAGNOSIS — Z0000 Encounter for general adult medical examination without abnormal findings: Secondary | ICD-10-CM | POA: Insufficient documentation

## 2016-05-21 DIAGNOSIS — R6889 Other general symptoms and signs: Secondary | ICD-10-CM | POA: Diagnosis not present

## 2016-05-21 DIAGNOSIS — Z862 Personal history of diseases of the blood and blood-forming organs and certain disorders involving the immune mechanism: Secondary | ICD-10-CM | POA: Diagnosis not present

## 2016-05-21 LAB — COMPREHENSIVE METABOLIC PANEL
ALK PHOS: 55 U/L (ref 39–117)
ALT: 11 U/L (ref 0–35)
AST: 15 U/L (ref 0–37)
Albumin: 4.4 g/dL (ref 3.5–5.2)
BILIRUBIN TOTAL: 0.4 mg/dL (ref 0.2–1.2)
BUN: 9 mg/dL (ref 6–23)
CALCIUM: 9.8 mg/dL (ref 8.4–10.5)
CO2: 26 meq/L (ref 19–32)
CREATININE: 0.66 mg/dL (ref 0.40–1.20)
Chloride: 105 mEq/L (ref 96–112)
GFR: 111.16 mL/min (ref >60.00)
Glucose, Bld: 92 mg/dL (ref 70–99)
Potassium: 4.3 mEq/L (ref 3.5–5.1)
Sodium: 137 mEq/L (ref 135–145)
TOTAL PROTEIN: 7.2 g/dL (ref 6.0–8.3)

## 2016-05-21 LAB — CBC
HCT: 35.4 % — ABNORMAL LOW (ref 36.0–46.0)
HEMOGLOBIN: 11.4 g/dL — AB (ref 12.0–15.0)
MCHC: 32.3 g/dL (ref 30.0–36.0)
MCV: 77.9 fl — ABNORMAL LOW (ref 78.0–100.0)
PLATELETS: 340 10*3/uL (ref 150.0–400.0)
RBC: 4.55 Mil/uL (ref 3.87–5.11)
RDW: 16.4 % — ABNORMAL HIGH (ref 11.5–15.5)
WBC: 8.4 10*3/uL (ref 4.0–10.5)

## 2016-05-21 LAB — LIPID PANEL
CHOL/HDL RATIO: 3
CHOLESTEROL: 150 mg/dL (ref 0–200)
HDL: 52.1 mg/dL (ref >39.00)
LDL CALC: 80 mg/dL (ref 0–99)
NonHDL: 98.02
TRIGLYCERIDES: 91 mg/dL (ref 0.0–149.0)
VLDL: 18.2 mg/dL (ref 0.0–40.0)

## 2016-05-21 LAB — HEMOGLOBIN A1C: Hgb A1c MFr Bld: 5.6 % (ref 4.6–6.5)

## 2016-05-21 NOTE — Assessment & Plan Note (Signed)
Tdap, HIV/STD screening, Pap smear up-to-date. Screening labs today. See orders.

## 2016-05-21 NOTE — Progress Notes (Signed)
Pre visit review using our clinic review tool, if applicable. No additional management support is needed unless otherwise documented below in the visit note. 

## 2016-05-21 NOTE — Assessment & Plan Note (Signed)
New problem. Patient concerned about adult ADD. Sending to psychology for formal assessment.

## 2016-05-21 NOTE — Patient Instructions (Signed)
We will call with your referral and with the lab results.  Follow up to be arranged after seeing psychologist.  Take care  [place] Maintenance, Female Adopting a healthy lifestyle and getting preventive care can go a long way to promote health and wellness. Talk with your health care provider about what schedule of regular examinations is right for you. This is a good chance for you to check in with your provider about disease prevention and staying healthy. In between checkups, there are plenty of things you can do on your own. Experts have done a lot of research about which lifestyle changes and preventive measures are most likely to keep you healthy. Ask your health care provider for more information. WEIGHT AND DIET  Eat a healthy diet  Be sure to include plenty of vegetables, fruits, low-fat dairy products, and lean protein.  Do not eat a lot of foods high in solid fats, added sugars, or salt.  Get regular exercise. This is one of the most important things you can do for your health.  Most adults should exercise for at least 150 minutes each week. The exercise should increase your heart rate and make you sweat (moderate-intensity exercise).  Most adults should also do strengthening exercises at least twice a week. This is in addition to the moderate-intensity exercise.  Maintain a healthy weight  Body mass index (BMI) is a measurement that can be used to identify possible weight problems. It estimates body fat based on height and weight. Your health care provider can help determine your BMI and help you achieve or maintain a healthy weight.  For females 34 years of age and older:   A BMI below 18.5 is considered underweight.  A BMI of 18.5 to 24.9 is normal.  A BMI of 25 to 29.9 is considered overweight.  A BMI of 30 and above is considered obese.  Watch levels of cholesterol and blood lipids  You should start having your blood tested for lipids and cholesterol  at 31 years of age, then have this test every 5 years.  You may need to have your cholesterol levels checked more often if:  Your lipid or cholesterol levels are high.  You are older than 31 years of age.  You are at high risk for heart disease.  CANCER SCREENING   Lung Cancer  Lung cancer screening is recommended for adults 12-20 years old who are at high risk for lung cancer because of a history of smoking.  A yearly low-dose CT scan of the lungs is recommended for people who:  Currently smoke.  Have quit within the past 15 years.  Have at least a 30-pack-year history of smoking. A pack year is smoking an average of one pack of cigarettes a day for 1 year.  Yearly screening should continue until it has been 15 years since you quit.  Yearly screening should stop if you develop a health problem that would prevent you from having lung cancer treatment.  Breast Cancer  Practice breast self-awareness. This means understanding how your breasts normally appear and feel.  It also means doing regular breast self-exams. Let your health care provider know about any changes, no matter how small.  If you are in your 20s or 30s, you should have a clinical breast exam (CBE) by a health care provider every 1-3 years as part of a regular health exam.  If you are 41 or older, have a CBE every year. Also consider having a breast  X-ray (mammogram) every year.  If you have a family history of breast cancer, talk to your health care provider about genetic screening.  If you are at high risk for breast cancer, talk to your health care provider about having an MRI and a mammogram every year.  Breast cancer gene (BRCA) assessment is recommended for women who have family members with BRCA-related cancers. BRCA-related cancers include:  Breast.  Ovarian.  Tubal.  Peritoneal cancers.  Results of the assessment will determine the need for genetic counseling and BRCA1 and BRCA2  testing. Cervical Cancer Your health care provider may recommend that you be screened regularly for cancer of the pelvic organs (ovaries, uterus, and vagina). This screening involves a pelvic examination, including checking for microscopic changes to the surface of your cervix (Pap test). You may be encouraged to have this screening done every 3 years, beginning at age 44.  For women ages 59-65, health care providers may recommend pelvic exams and Pap testing every 3 years, or they may recommend the Pap and pelvic exam, combined with testing for human papilloma virus (HPV), every 5 years. Some types of HPV increase your risk of cervical cancer. Testing for HPV may also be done on women of any age with unclear Pap test results.  Other health care providers may not recommend any screening for nonpregnant women who are considered low risk for pelvic cancer and who do not have symptoms. Ask your health care provider if a screening pelvic exam is right for you.  If you have had past treatment for cervical cancer or a condition that could lead to cancer, you need Pap tests and screening for cancer for at least 20 years after your treatment. If Pap tests have been discontinued, your risk factors (such as having a new sexual partner) need to be reassessed to determine if screening should resume. Some women have medical problems that increase the chance of getting cervical cancer. In these cases, your health care provider may recommend more frequent screening and Pap tests. Colorectal Cancer  This type of cancer can be detected and often prevented.  Routine colorectal cancer screening usually begins at 31 years of age and continues through 31 years of age.  Your health care provider may recommend screening at an earlier age if you have risk factors for colon cancer.  Your health care provider may also recommend using home test kits to check for hidden blood in the stool.  A small camera at the end of a  tube can be used to examine your colon directly (sigmoidoscopy or colonoscopy). This is done to check for the earliest forms of colorectal cancer.  Routine screening usually begins at age 39.  Direct examination of the colon should be repeated every 5-10 years through 31 years of age. However, you may need to be screened more often if early forms of precancerous polyps or small growths are found. Skin Cancer  Check your skin from head to toe regularly.  Tell your health care provider about any new moles or changes in moles, especially if there is a change in a mole's shape or color.  Also tell your health care provider if you have a mole that is larger than the size of a pencil eraser.  Always use sunscreen. Apply sunscreen liberally and repeatedly throughout the day.  Protect yourself by wearing long sleeves, pants, a wide-brimmed hat, and sunglasses whenever you are outside. HEART DISEASE, DIABETES, AND HIGH BLOOD PRESSURE   High blood pressure causes heart  disease and increases the risk of stroke. High blood pressure is more likely to develop in:  People who have blood pressure in the high end of the normal range (130-139/85-89 mm Hg).  People who are overweight or obese.  People who are African American.  If you are 71-41 years of age, have your blood pressure checked every 3-5 years. If you are 58 years of age or older, have your blood pressure checked every year. You should have your blood pressure measured twice--once when you are at a hospital or clinic, and once when you are not at a hospital or clinic. Record the average of the two measurements. To check your blood pressure when you are not at a hospital or clinic, you can use:  An automated blood pressure machine at a pharmacy.  A home blood pressure monitor.  If you are between 61 years and 78 years old, ask your health care provider if you should take aspirin to prevent strokes.  Have regular diabetes screenings. This  involves taking a blood sample to check your fasting blood sugar level.  If you are at a normal weight and have a low risk for diabetes, have this test once every three years after 31 years of age.  If you are overweight and have a high risk for diabetes, consider being tested at a younger age or more often. PREVENTING INFECTION  Hepatitis B  If you have a higher risk for hepatitis B, you should be screened for this virus. You are considered at high risk for hepatitis B if:  You were born in a country where hepatitis B is common. Ask your health care provider which countries are considered high risk.  Your parents were born in a high-risk country, and you have not been immunized against hepatitis B (hepatitis B vaccine).  You have HIV or AIDS.  You use needles to inject street drugs.  You live with someone who has hepatitis B.  You have had sex with someone who has hepatitis B.  You get hemodialysis treatment.  You take certain medicines for conditions, including cancer, organ transplantation, and autoimmune conditions. Hepatitis C  Blood testing is recommended for:  Everyone born from [birth year] through 1966.  Anyone with known risk factors for hepatitis C. Sexually transmitted infections (STIs)  You should be screened for sexually transmitted infections (STIs) including gonorrhea and chlamydia if:  You are sexually active and are younger than 31 years of age.  You are older than 31 years of age and your health care provider tells you that you are at risk for this type of infection.  Your sexual activity has changed since you were last screened and you are at an increased risk for chlamydia or gonorrhea. Ask your health care provider if you are at risk.  If you do not have HIV, but are at risk, it may be recommended that you take a prescription medicine daily to prevent HIV infection. This is called pre-exposure prophylaxis (PrEP). You are considered at risk if:  You are  sexually active and do not regularly use condoms or know the HIV status of your partner(s).  You take drugs by injection.  You are sexually active with a partner who has HIV. Talk with your health care provider about whether you are at high risk of being infected with HIV. If you choose to begin PrEP, you should first be tested for HIV. You should then be tested every 3 months for as long as you are taking  PrEP.  PREGNANCY   If you are premenopausal and you may become pregnant, ask your health care provider about preconception counseling.  If you may become pregnant, take 400 to 800 micrograms (mcg) of folic acid every day.  If you want to prevent pregnancy, talk to your health care provider about birth control (contraception). OSTEOPOROSIS AND MENOPAUSE   Osteoporosis is a disease in which the bones lose minerals and strength with aging. This can result in serious bone fractures. Your risk for osteoporosis can be identified using a bone density scan.  If you are 65 years of age or older, or if you are at risk for osteoporosis and fractures, ask your health care provider if you should be screened.  Ask your health care provider whether you should take a calcium or vitamin D supplement to lower your risk for osteoporosis.  Menopause may have certain physical symptoms and risks.  Hormone replacement therapy may reduce some of these symptoms and risks. Talk to your health care provider about whether hormone replacement therapy is right for you.  HOME CARE INSTRUCTIONS   Schedule regular health, dental, and eye exams.  Stay current with your immunizations.   Do not use any tobacco products including cigarettes, chewing tobacco, or electronic cigarettes.  If you are pregnant, do not drink alcohol.  If you are breastfeeding, limit how much and how often you drink alcohol.  Limit alcohol intake to no more than 1 drink per day for nonpregnant women. One drink equals 12 ounces of beer, 5  ounces of wine, or 1 ounces of hard liquor.  Do not use street drugs.  Do not share needles.  Ask your health care provider for help if you need support or information about quitting drugs.  Tell your health care provider if you often feel depressed.  Tell your health care provider if you have ever been abused or do not feel safe at home.   This information is not intended to replace advice given to you by your health care provider. Make sure you discuss any questions you have with your health care provider.   Document Released: 05/25/2011 Document Revised: 11/30/2014 Document Reviewed: 10/11/2013 Elsevier Interactive Patient Education [employer].

## 2016-05-21 NOTE — Progress Notes (Signed)
Subjective:  Patient ID: Megan Tucker, female    DOB: 06-16-1986  Age: 30 y.o. MRN: [id number]  CC: Establish care; Concern for ADD  HPI Megan Tucker is a 30 y.o. female presents to the clinic today to establish care. Additional concern below.  Preventative Healthcare  Pap smear: Up to date. 12/2014.  Immunizations  Tetanus - Up to date, 2014.   Labs: Screening labs today.   Alcohol use: See below.   Smoking/tobacco use: Former smoker.  STD/HIV testing: Has had screening.  Concern for ADD  Patient reports longstanding difficulty with focus and attention.  Patient states that as a child she was hyperactive and likely had ADHD.  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 She states that she will be going back to school soon.  She states that she has considerable difficulty focusing, and remembering details.  She states that she often ends "spaces out". She has trouble completing tasks.  She states that she has looked up her symptoms online and they are consistent with adult ADD.  She would like to discuss this today. She is requesting treatment.   PMH, Surgical Hx, Family Hx, Social History reviewed and updated as below.  Past Medical History  Diagnosis Date  . Anemia   . Gestational diabetes   . Anemia    Past Surgical History  Procedure Laterality Date  . Mouth surgery    . Tubal ligation  2014   Family History  Problem Relation Age of Onset  . Other Neg Hx   . Drug abuse Mother   . Stroke Mother   . Hypertension Mother   . Mental illness Mother   . Diabetes Mother   . Diabetes Father   . Hypertension Maternal Grandmother   . Diabetes Maternal Grandmother   . Diabetes Maternal Grandfather   . Diabetes Paternal Grandmother   . Diabetes Paternal Grandfather    Social History  Substance Use Topics  . Smoking status: Former Research scientist (life sciences)  . Smokeless tobacco: Never Used  . Alcohol Use: 0.0 - 0.6 oz/week    0-1 Standard drinks or equivalent  per week   Review of Systems  Psychiatric/Behavioral:       Inattention, Anxiety, Difficulty focusing.   All other systems reviewed and are negative.  Objective:   Today's Vitals: BP 126/84 mmHg  Pulse 93  Temp(Src) 98.5 F (36.9 C) (Oral)  Ht '5\' 4"'  (1.626 m)  Wt 163 lb (73.936 kg)  BMI 27.97 kg/m2  SpO2 98%  Breastfeeding? No  Physical Exam  Constitutional: She is oriented to person, place, and time. She appears well-developed and well-nourished. No distress.  HENT:  Head: Normocephalic and atraumatic.  Nose: Nose normal.  Mouth/Throat: Oropharynx is clear and moist. No oropharyngeal exudate.  Normal TM's bilaterally.  Poor dentition.  Eyes: Conjunctivae are normal. No scleral icterus.  Neck: Neck supple. No thyromegaly present.  Cardiovascular: Normal rate and regular rhythm.   No murmur heard. Pulmonary/Chest: Effort normal and breath sounds normal. She has no wheezes. She has no rales.  Abdominal: Soft. She exhibits no distension. There is no tenderness. There is no rebound and no guarding.  Musculoskeletal: Normal range of motion. She exhibits no edema.  Lymphadenopathy:    She has no cervical adenopathy.  Neurological: She is alert and oriented to person, place, and time.  Skin: Skin is warm and dry. No rash noted.  Psychiatric: She has a normal mood and affect.  Vitals reviewed.  Assessment &  Plan:   Problem List Items Addressed This Visit    Encounter for preventative adult health care exam with abnormal findings - Primary    Tdap, HIV/STD screening, Pap smear up-to-date. Screening labs today. See orders.      Inattention    New problem. Patient concerned about adult ADD. Sending to psychology for formal assessment.      Relevant Orders   Ambulatory referral to Psychology    Other Visit Diagnoses    History of anemia        Relevant Orders    CBC    History of gestational diabetes        Relevant Orders    Comp Met (CMET)    HgB A1c    Lipid  Profile       Outpatient Encounter Prescriptions as of 05/21/2016  Medication Sig  . [DISCONTINUED] Prenatal Vit-Fe Fumarate-FA (PRENATAL MULTIVITAMIN) TABS Take 1 tablet by mouth daily.   No facility-administered encounter medications on file as of 05/21/2016.    Follow-up: Annually.   [place]

## 2016-05-22 ENCOUNTER — Telehealth: Payer: Self-pay | Admitting: Family Medicine

## 2016-05-22 NOTE — Telephone Encounter (Signed)
Patient stated she received lab results yesterday and has no question regarding those, but wanted to follow up with where the psychiatry referral was sent.  Made aware it was sent to [facility] and she would be contacted by that facility to follow up.  Verbalized understanding.

## 2016-05-22 NOTE — Telephone Encounter (Signed)
Patient calling wanting lab results requesting a referral to psychiatry for he ADHD.

## 2016-07-03 ENCOUNTER — Ambulatory Visit (INDEPENDENT_AMBULATORY_CARE_PROVIDER_SITE_OTHER): Payer: 59 | Admitting: Psychology

## 2016-07-03 DIAGNOSIS — F902 Attention-deficit hyperactivity disorder, combined type: Secondary | ICD-10-CM

## 2016-07-03 DIAGNOSIS — F3341 Major depressive disorder, recurrent, in partial remission: Secondary | ICD-10-CM | POA: Diagnosis not present

## 2016-07-03 DIAGNOSIS — F5081 Binge eating disorder: Secondary | ICD-10-CM

## 2016-07-11 DIAGNOSIS — F5081 Binge eating disorder: Secondary | ICD-10-CM | POA: Insufficient documentation

## 2016-07-11 DIAGNOSIS — F50819 Binge eating disorder, unspecified: Secondary | ICD-10-CM | POA: Insufficient documentation

## 2016-07-20 ENCOUNTER — Ambulatory Visit (INDEPENDENT_AMBULATORY_CARE_PROVIDER_SITE_OTHER): Payer: 59 | Admitting: Psychology

## 2016-07-20 DIAGNOSIS — F902 Attention-deficit hyperactivity disorder, combined type: Secondary | ICD-10-CM

## 2016-07-20 DIAGNOSIS — F411 Generalized anxiety disorder: Secondary | ICD-10-CM | POA: Diagnosis not present

## 2016-07-20 DIAGNOSIS — F5081 Binge eating disorder: Secondary | ICD-10-CM

## 2016-07-20 DIAGNOSIS — F3341 Major depressive disorder, recurrent, in partial remission: Secondary | ICD-10-CM

## 2016-07-22 ENCOUNTER — Encounter: Payer: Self-pay | Admitting: Family Medicine

## 2016-07-22 ENCOUNTER — Ambulatory Visit (INDEPENDENT_AMBULATORY_CARE_PROVIDER_SITE_OTHER): Payer: 59 | Admitting: Family Medicine

## 2016-07-22 DIAGNOSIS — Z23 Encounter for immunization: Secondary | ICD-10-CM | POA: Diagnosis not present

## 2016-07-22 DIAGNOSIS — F902 Attention-deficit hyperactivity disorder, combined type: Secondary | ICD-10-CM

## 2016-07-22 DIAGNOSIS — F909 Attention-deficit hyperactivity disorder, unspecified type: Secondary | ICD-10-CM | POA: Insufficient documentation

## 2016-07-22 MED ORDER — AMPHETAMINE-DEXTROAMPHET ER 20 MG PO CP24: 20.0000 mg | ORAL_CAPSULE | Freq: Every day | ORAL | 0 refills | Status: DC

## 2016-07-22 NOTE — Assessment & Plan Note (Signed)
New problem, New diagnosis. Starting Adderall XR today.

## 2016-07-22 NOTE — Progress Notes (Signed)
   Subjective:  Patient ID: Megan Tucker, female    DOB: [birth year]  Age: 30 y.o. MRN: [id number]  CC: Follow up  HPI:  30 year old female with prior complaints of inattention/difficulty concentrating presents for follow up.  Since our last visit, patient has seen psychology for evaluation for potential ADD/ADHD. Her visit was faxed to our office and was reviewed in detail today. In summary, patient met diagnostic criteria for ADHD (combined), Major depressive disorder, GAD, and Binge eating disorder.   She would like to discuss treatment for ADHD. She has never had treatment previously. She is interested in medication options. Will discuss today.  She feels like the other additional diagnoses that were given are not problematic/bothersome for her.  Social Hx   Social History   Social History  . Marital status: Single    Spouse name: N/A  . Number of children: N/A  . Years of education: N/A   Social History Main Topics  . Smoking status: Former Research scientist (life sciences)  . Smokeless tobacco: Never Used  . Alcohol use 0.0 - 0.6 oz/week  . Drug use: No  . Sexual activity: Yes   Other Topics Concern  . None   Social History Narrative  . None   Review of Systems  Constitutional: Negative.   Psychiatric/Behavioral: Positive for decreased concentration.   Objective:  BP (!) 138/98 (BP Location: Left Arm, Patient Position: Sitting, Cuff Size: Normal)   Pulse 95   Temp 97.9 F (36.6 C) (Oral)   Wt 169 lb 2 oz (76.7 kg)   SpO2 99%   BMI 29.03 kg/m   BP/Weight 07/22/2016 5/45/6256 01/29/9372  Systolic BP 428 768 115  Diastolic BP 98 84 70  Wt. (Lbs) 169.13 163 -  BMI 29.03 27.97 -   Physical Exam  Constitutional: She is oriented to person, place, and time. She appears well-developed. No distress.  Cardiovascular: Normal rate and regular rhythm.   Pulmonary/Chest: Effort normal.  Neurological: She is alert and oriented to person, place, and time.  Psychiatric: She has a normal mood and  affect.  Vitals reviewed.  Lab Results  Component Value Date   WBC 8.4 05/21/2016   HGB 11.4 (L) 05/21/2016   HCT 35.4 (L) 05/21/2016   PLT 340.0 05/21/2016   GLUCOSE 92 05/21/2016   CHOL 150 05/21/2016   TRIG 91.0 05/21/2016   HDL 52.10 05/21/2016   LDLCALC 80 05/21/2016   ALT 11 05/21/2016   AST 15 05/21/2016   NA 137 05/21/2016   K 4.3 05/21/2016   CL 105 05/21/2016   CREATININE 0.66 05/21/2016   BUN 9 05/21/2016   CO2 26 05/21/2016   HGBA1C 5.6 05/21/2016    Assessment & Plan:   Problem List Items Addressed This Visit    Attention deficit hyperactivity disorder (ADHD)    New problem, New diagnosis. Starting Adderall XR today.       Other Visit Diagnoses    Encounter for immunization       Relevant Orders   Flu Vaccine QUAD 36+ mos IM (Completed)      Meds ordered this encounter  Medications  . amphetamine-dextroamphetamine (ADDERALL XR) 20 MG 24 hr capsule    Sig: Take 1 capsule (20 mg total) by mouth daily.    Dispense:  30 capsule    Refill:  0    Follow-up: Return in about 1 month (around 08/22/2016).  [place]

## 2016-07-22 NOTE — Progress Notes (Signed)
Pre visit review using our clinic review tool, if applicable. No additional management support is needed unless otherwise documented below in the visit note. 

## 2016-07-22 NOTE — Patient Instructions (Signed)
Take the medication as prescribed.  Call or follow up in 1 month.  Take care  Dr. Adriana Simasook

## 2016-08-17 ENCOUNTER — Ambulatory Visit: Payer: 59 | Admitting: Psychology

## 2016-08-28 ENCOUNTER — Encounter: Payer: Self-pay | Admitting: Family Medicine

## 2016-08-28 ENCOUNTER — Ambulatory Visit (INDEPENDENT_AMBULATORY_CARE_PROVIDER_SITE_OTHER): Payer: 59 | Admitting: Family Medicine

## 2016-08-28 DIAGNOSIS — F902 Attention-deficit hyperactivity disorder, combined type: Secondary | ICD-10-CM

## 2016-08-28 MED ORDER — AMPHETAMINE-DEXTROAMPHET ER 20 MG PO CP24: 40.0000 mg | ORAL_CAPSULE | Freq: Every day | ORAL | 0 refills | Status: DC

## 2016-08-28 NOTE — Progress Notes (Unsigned)
Pre visit review using our clinic review tool, if applicable. No additional management support is needed unless otherwise documented below in the visit note. 

## 2016-08-28 NOTE — Progress Notes (Signed)
Subjective:  Patient ID: Megan Tucker, female    DOB: [birth year]  Age: 30 y.o. MRN: [id number]  CC: ADHD  HPI:  30 year old female presents for follow-up regarding ADHD.  Patient has had a psychological evaluation. It revealed that she has findings consistent with ADHD in addition to (Depression, Binge eating, and Anxiety). Patient has elected not to address the other items.  Patient has been on Adderall for 1 month. She states that she did incredibly well during the first 2 weeks. She noticed an increase in function and ability to handle numerous tasks. She states that she was not stressed at that time. She states that the last 2 weeks she's had significant difficulty coping and feels like the medication is not working at all. She states that she feels overwhelmed and has been having issues with inattention. She has ongoing stressors at work and at home. She would like to discuss this today.  Social Hx   Social History   Social History  . Marital status: Single    Spouse name: N/A  . Number of children: N/A  . Years of education: N/A   Social History Main Topics  . Smoking status: Former Games developer  . Smokeless tobacco: Never Used  . Alcohol use 0.0 - 0.6 oz/week  . Drug use: No  . Sexual activity: Yes   Other Topics Concern  . None   Social History Narrative  . None   Review of Systems  Constitutional: Positive for fatigue.  Psychiatric/Behavioral: Positive for agitation and decreased concentration. The patient is nervous/anxious.    Objective:  BP 132/78 (BP Location: Right Arm, Patient Position: Sitting, Cuff Size: Normal)   Pulse 95   Temp 98.3 F (36.8 C) (Oral)   Wt 154 lb 4 oz (70 kg)   SpO2 99%   BMI 26.48 kg/m   BP/Weight 08/28/2016 07/22/2016 05/21/2016  Systolic BP 132 138 126  Diastolic BP 78 98 84  Wt. (Lbs) 154.25 169.13 163  BMI 26.48 29.03 27.97   Physical Exam  Constitutional: She is oriented to person, place, and time. She appears well-developed.  No distress.  HENT:  Head: Normocephalic and atraumatic.  Eyes: Conjunctivae are normal.  Pulmonary/Chest: Effort normal.  Neurological: She is oriented to person, place, and time.  Psychiatric: She has a normal mood and affect.  Vitals reviewed.  Lab Results  Component Value Date   WBC 8.4 05/21/2016   HGB 11.4 (L) 05/21/2016   HCT 35.4 (L) 05/21/2016   PLT 340.0 05/21/2016   GLUCOSE 92 05/21/2016   CHOL 150 05/21/2016   TRIG 91.0 05/21/2016   HDL 52.10 05/21/2016   LDLCALC 80 05/21/2016   ALT 11 05/21/2016   AST 15 05/21/2016   NA 137 05/21/2016   K 4.3 05/21/2016   CL 105 05/21/2016   CREATININE 0.66 05/21/2016   BUN 9 05/21/2016   CO2 26 05/21/2016   HGBA1C 5.6 05/21/2016   Assessment & Plan:   Problem List Items Addressed This Visit    Attention deficit hyperactivity disorder (ADHD)    Established problem, worsening. Patient states that she responded well initially but then had worsening/return of her ADHD symptoms. I discussed with her at length today that this is likely secondary to her other diagnoses that she has not addressed. Patient would like to increase the dosage of her medication to see if this will help. I feel that this is reasonable. Rx given for 40 mg Adderall XR. If continues to have difficulty would benefit  from SSRI if willing.       Other Visit Diagnoses   None.    Meds ordered this encounter  Medications  . amphetamine-dextroamphetamine (ADDERALL XR) 20 MG 24 hr capsule    Sig: Take 2 capsules (40 mg total) by mouth daily.    Dispense:  60 capsule    Refill:  0    Follow-up: Return in about 1 month (around 09/28/2016).  Everlene OtherJayce Sereniti Wan DO [facility] [place]

## 2016-08-28 NOTE — Patient Instructions (Signed)
Call for refill.  Let me know how you're doing over the next few weeks.  Take care  Dr. Adriana Simasook

## 2016-08-28 NOTE — Assessment & Plan Note (Addendum)
Established problem, worsening. Patient states that she responded well initially but then had worsening/return of her ADHD symptoms. I discussed with her at length today that this is likely secondary to her other diagnoses that she has not addressed. Patient would like to increase the dosage of her medication to see if this will help. I feel that this is reasonable. Rx given for 40 mg Adderall XR. If continues to have difficulty would benefit from SSRI if willing.

## 2016-08-28 NOTE — Progress Notes (Signed)
Pre visit review using our clinic review tool, if applicable. No additional management support is needed unless otherwise documented below in the visit note. 

## 2016-09-04 ENCOUNTER — Ambulatory Visit: Payer: 59 | Admitting: Psychology

## 2016-10-01 ENCOUNTER — Ambulatory Visit: Payer: Self-pay | Admitting: Family Medicine

## 2016-10-21 ENCOUNTER — Other Ambulatory Visit: Payer: Self-pay | Admitting: Family Medicine

## 2016-10-21 MED ORDER — AMPHETAMINE-DEXTROAMPHET ER 20 MG PO CP24: 40.0000 mg | ORAL_CAPSULE | Freq: Every day | ORAL | 0 refills | Status: DC

## 2016-10-21 NOTE — Telephone Encounter (Signed)
Pt called about needing a refill for amphetamine-dextroamphetamine (ADDERALL XR) 20 MG 24 hr capsule.  [facility] Thank you!  Call pt @ [phone].

## 2016-10-21 NOTE — Telephone Encounter (Signed)
Refilled 08/28/16. Pt last seen 08/28/16. Please advise?

## 2016-10-22 ENCOUNTER — Other Ambulatory Visit: Payer: Self-pay | Admitting: Family Medicine

## 2016-10-22 NOTE — Telephone Encounter (Signed)
Pt was called and told RX was upfront ready for pick up.

## 2016-11-24 ENCOUNTER — Other Ambulatory Visit: Payer: Self-pay | Admitting: Family Medicine

## 2016-11-24 MED ORDER — AMPHETAMINE-DEXTROAMPHET ER 20 MG PO CP24: 40.0000 mg | ORAL_CAPSULE | Freq: Every day | ORAL | 0 refills | Status: DC

## 2016-11-24 NOTE — Telephone Encounter (Signed)
Refilled 10/21/16. Pt last seen 08/28/16. Please advise?

## 2017-01-11 ENCOUNTER — Other Ambulatory Visit: Payer: Self-pay | Admitting: Family Medicine

## 2017-01-11 NOTE — Telephone Encounter (Signed)
Refilled 11/24/16. Pt last seen 08/28/16. Please advise?

## 2017-01-12 MED ORDER — AMPHETAMINE-DEXTROAMPHET ER 20 MG PO CP24: 40.0000 mg | ORAL_CAPSULE | Freq: Every day | ORAL | 0 refills | Status: DC

## 2017-01-12 NOTE — Telephone Encounter (Signed)
Pt was made aware of her rx being ready and placed up front for pick up.

## 2017-02-21 ENCOUNTER — Other Ambulatory Visit: Payer: Self-pay | Admitting: Family Medicine

## 2017-02-22 MED ORDER — AMPHETAMINE-DEXTROAMPHET ER 20 MG PO CP24: 40.0000 mg | ORAL_CAPSULE | Freq: Every day | ORAL | 0 refills | Status: DC

## 2017-02-22 NOTE — Telephone Encounter (Signed)
Refilled: 01/12/17 Last OV: 08/28/17 Last Labs: 05/21/16 Future OV: none Please advise?

## 2017-02-22 NOTE — Telephone Encounter (Signed)
Pt called and advised of rx being ready up front.

## 2017-04-06 ENCOUNTER — Other Ambulatory Visit: Payer: Self-pay | Admitting: Family Medicine

## 2017-04-07 MED ORDER — AMPHETAMINE-DEXTROAMPHET ER 20 MG PO CP24: 40.0000 mg | ORAL_CAPSULE | Freq: Every day | ORAL | 0 refills | Status: DC

## 2017-04-07 NOTE — Telephone Encounter (Signed)
Pt was MyCharted

## 2017-04-07 NOTE — Telephone Encounter (Signed)
Refilled: 02/22/17 Last OV: 08/28/16 Last Labs: 05/21/16 Future OV: none Please advise?

## 2017-05-17 ENCOUNTER — Telehealth: Payer: Self-pay | Admitting: Family Medicine

## 2017-05-17 NOTE — Telephone Encounter (Signed)
Refill request for Adderall, last seen 06OCT2018, last filled 16/MAY2018.  Please advise.

## 2017-05-18 ENCOUNTER — Other Ambulatory Visit: Payer: Self-pay | Admitting: Family Medicine

## 2017-05-18 MED ORDER — AMPHETAMINE-DEXTROAMPHET ER 20 MG PO CP24: 40.0000 mg | ORAL_CAPSULE | Freq: Every day | ORAL | 0 refills | Status: DC

## 2017-05-18 NOTE — Telephone Encounter (Signed)
Can pick up Rx.

## 2017-05-18 NOTE — Telephone Encounter (Signed)
Please advise for refill, last fill was

## 2017-05-18 NOTE — Telephone Encounter (Signed)
Patient advised script ready placed up front for pick up

## 2017-07-04 ENCOUNTER — Other Ambulatory Visit: Payer: Self-pay | Admitting: Family Medicine

## 2017-07-05 ENCOUNTER — Other Ambulatory Visit: Payer: Self-pay | Admitting: Family Medicine

## 2017-07-06 MED ORDER — AMPHETAMINE-DEXTROAMPHET ER 20 MG PO CP24: 40.0000 mg | ORAL_CAPSULE | Freq: Every day | ORAL | 0 refills | Status: DC

## 2017-07-06 NOTE — Telephone Encounter (Signed)
Patient advised script ready for pick up , script placed up front for pick up.

## 2017-08-11 ENCOUNTER — Other Ambulatory Visit: Payer: Self-pay | Admitting: Family Medicine

## 2017-08-11 NOTE — Telephone Encounter (Signed)
Please advise for refill, thanks 

## 2017-08-12 MED ORDER — AMPHETAMINE-DEXTROAMPHET ER 20 MG PO CP24: 40.0000 mg | ORAL_CAPSULE | Freq: Every day | ORAL | 0 refills | Status: DC

## 2017-08-12 NOTE — Telephone Encounter (Addendum)
Patient advised script ready for pick up.  Script placed up front for pick up

## 2017-08-25 ENCOUNTER — Ambulatory Visit: Payer: Self-pay | Admitting: Family

## 2017-08-26 ENCOUNTER — Ambulatory Visit: Payer: Self-pay | Admitting: Family

## 2017-09-06 ENCOUNTER — Ambulatory Visit (INDEPENDENT_AMBULATORY_CARE_PROVIDER_SITE_OTHER): Payer: 59 | Admitting: Family

## 2017-09-06 ENCOUNTER — Encounter: Payer: Self-pay | Admitting: Family

## 2017-09-06 VITALS — BP 134/90 | HR 102 | Temp 97.9°F | Ht 64.0 in | Wt 150.2 lb

## 2017-09-06 DIAGNOSIS — F902 Attention-deficit hyperactivity disorder, combined type: Secondary | ICD-10-CM | POA: Diagnosis not present

## 2017-09-06 MED ORDER — AMPHETAMINE-DEXTROAMPHET ER 20 MG PO CP24: 40.0000 mg | ORAL_CAPSULE | Freq: Every day | ORAL | 0 refills | Status: DC

## 2017-09-06 NOTE — Progress Notes (Signed)
Subjective:    Patient ID: Megan Tucker, female    DOB: 1986/03/24, 31 y.o.   MRN: [id number]  CC: Megan Tucker is a 31 y.o. female who presents today for follow up.   HPI: ADHD - 40 mg adderall in morning. Sleeping okay.  Takes breaks from medication- doesn't take when not working. Main reason starting medication was due to managing work and being single mom.   Diagnosed formally ADHD 06/2016 and started medication at that time.    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 Resolved completely now taking medication every day.  Denies exertional chest pain or pressure, numbness or tingling radiating to left arm or jaw, palpitations, dizziness, frequent headaches, changes in vision, or shortness of breath.    No thoughts of hurting herself or anyone else.       HISTORY:  Past Medical History:  Diagnosis Date  . Anemia   . Anemia   . Gestational diabetes    Past Surgical History:  Procedure Laterality Date  . MOUTH SURGERY    . TUBAL LIGATION  2014   Family History  Problem Relation Age of Onset  . Drug abuse Mother        narcotic addition  . Stroke Mother   . Hypertension Mother   . Mental illness Mother   . Diabetes Mother   . Diabetes Father   . Hypertension Maternal Grandmother   . Diabetes Maternal Grandmother   . Diabetes Maternal Grandfather   . Diabetes Paternal Grandmother   . Diabetes Paternal Grandfather   . Other Neg Hx     Allergies: Adhesive [tape] No current outpatient prescriptions on file prior to visit.   No current facility-administered medications on file prior to visit.     Social History  Substance Use Topics  . Smoking status: Former Games developer  . Smokeless tobacco: Never Used  . Alcohol use 0.0 - 0.6 oz/week    Review of Systems  Constitutional: Negative for chills and fever.  Respiratory: Negative for cough.     Cardiovascular: Negative for chest pain and palpitations.  Gastrointestinal: Negative for nausea and vomiting.  Psychiatric/Behavioral: Negative for self-injury, sleep disturbance and suicidal ideas.      Objective:    BP 134/90   Pulse (!) 102   Temp 97.9 F (36.6 C) (Oral)   Ht  (1.626 m)   Wt 150 lb 3.2 oz (68.1 kg)   SpO2 99%   BMI 25.78 kg/m  BP Readings from Last 3 Encounters:  09/06/17 134/90  08/28/16 132/78  07/22/16 (!) 138/98   Wt Readings from Last 3 Encounters:  09/06/17 150 lb 3.2 oz (68.1 kg)  08/28/16 154 lb 4 oz (70 kg)  07/22/16 169 lb 2 oz (76.7 kg)    Physical Exam  Constitutional: She appears well-developed and well-nourished.  Eyes: Conjunctivae are normal.  Cardiovascular: Normal rate, regular rhythm, normal heart sounds and normal pulses.   Pulmonary/Chest: Effort normal and breath sounds normal. She has no wheezes. She has no rhonchi. She has no rales.  Neurological: She is alert.  Skin: Skin is warm and dry.  Psychiatric: She has a normal mood and affect. Her speech is normal and behavior is normal. Thought content normal.  Vitals reviewed.      Assessment & Plan:   Problem List Items Addressed This Visit  Other   Attention deficit hyperactivity disorder (ADHD) - Primary    Stable on current regimen. Encouraged to stay on daily regimen due to depressive episode. Will let me know if recurs in any way. Will continue adderall since resolved.  I looked up patient on [place] Controlled Substances Reporting System and saw no activity that raised concern of inappropriate use.  refilled      Relevant Medications   amphetamine-dextroamphetamine (ADDERALL XR) 20 MG 24 hr capsule       I am having Megan Tucker maintain her amphetamine-dextroamphetamine.   Meds ordered this encounter  Medications  . amphetamine-dextroamphetamine (ADDERALL XR) 20 MG 24 hr capsule    Sig: Take 2 capsules (40 mg total) by mouth daily.    Dispense:  60  capsule    Refill:  0    Order Specific Question:   Supervising Provider    Answer:   Sherlene Shams [[id number]]    Return precautions given.   Risks, benefits, and alternatives of the medications and treatment plan prescribed today were discussed, and patient expressed understanding.   Education regarding symptom management and diagnosis given to patient on AVS.  Continue to follow with Allegra Grana, FNP for routine health maintenance.   Tyrone Nine and I agreed with plan.   Rennie Plowman, FNP

## 2017-09-06 NOTE — Patient Instructions (Addendum)
Pleasure meeting you  Let me know if you need anything.   Next appointment in new year a complete physical exam.

## 2017-09-06 NOTE — Assessment & Plan Note (Addendum)
Stable on current regimen. Encouraged to stay on daily regimen due to depressive episode. Will let me know if recurs in any way. Will continue adderall since resolved.  I looked up patient on [place] Controlled Substances Reporting System and saw no activity that raised concern of inappropriate use.  refilled

## 2017-09-06 NOTE — Progress Notes (Signed)
Pre visit review using our clinic review tool, if applicable. No additional management support is needed unless otherwise documented below in the visit note. 

## 2017-10-29 ENCOUNTER — Other Ambulatory Visit: Payer: Self-pay | Admitting: Family

## 2017-10-29 DIAGNOSIS — F902 Attention-deficit hyperactivity disorder, combined type: Secondary | ICD-10-CM

## 2017-10-29 NOTE — Telephone Encounter (Signed)
Refilled: 09/06/2017 Last OV: 09/06/2017 Next OV: not schedulded

## 2017-10-30 MED ORDER — AMPHETAMINE-DEXTROAMPHET ER 20 MG PO CP24: 40.0000 mg | ORAL_CAPSULE | Freq: Every day | ORAL | 0 refills | Status: DC

## 2017-10-30 NOTE — Telephone Encounter (Signed)
Refill given. Please place at front for pick up. Needs to set up follow-up appointment in the next several months.

## 2017-12-20 ENCOUNTER — Other Ambulatory Visit: Payer: Self-pay | Admitting: Family Medicine

## 2017-12-20 DIAGNOSIS — F902 Attention-deficit hyperactivity disorder, combined type: Secondary | ICD-10-CM

## 2017-12-21 NOTE — Telephone Encounter (Signed)
Okay to refill? Last filled by Birdie SonsSonnenberg on: 10/30/17 #60  LOV: 09/06/17 (Arnett) NOV: not scheduled  Arnett Patient

## 2017-12-22 ENCOUNTER — Other Ambulatory Visit: Payer: Self-pay | Admitting: Family Medicine

## 2017-12-22 DIAGNOSIS — F902 Attention-deficit hyperactivity disorder, combined type: Secondary | ICD-10-CM

## 2017-12-22 MED ORDER — AMPHETAMINE-DEXTROAMPHET ER 20 MG PO CP24: 40.0000 mg | ORAL_CAPSULE | Freq: Every day | ORAL | 0 refills | Status: DC

## 2017-12-22 NOTE — Telephone Encounter (Signed)
Refilled.  Patient needs to schedule an appointment with Claris CheMargaret for follow-up of her ADHD.  Thanks.

## 2017-12-22 NOTE — Telephone Encounter (Signed)
Placed at front desk, left message to return call and sent mychart message

## 2018-02-23 ENCOUNTER — Other Ambulatory Visit: Payer: Self-pay | Admitting: Family Medicine

## 2018-02-23 DIAGNOSIS — F902 Attention-deficit hyperactivity disorder, combined type: Secondary | ICD-10-CM

## 2018-02-24 ENCOUNTER — Telehealth: Payer: Self-pay

## 2018-02-24 ENCOUNTER — Other Ambulatory Visit: Payer: Self-pay | Admitting: Family Medicine

## 2018-02-24 DIAGNOSIS — F902 Attention-deficit hyperactivity disorder, combined type: Secondary | ICD-10-CM

## 2018-02-24 NOTE — Telephone Encounter (Signed)
Patient is needing a refill on Adderall XR but was told that she needs be seen first. She is scheduled to see Margarett on 03-07-18 at 10:45 for follow up. Patient wants to know if she can get the refill?

## 2018-02-24 NOTE — Telephone Encounter (Signed)
Last OV 09/06/17 last filled 12/22/17 60 0rf

## 2018-02-25 MED ORDER — AMPHETAMINE-DEXTROAMPHET ER 20 MG PO CP24
40.0000 mg | ORAL_CAPSULE | Freq: Every day | ORAL | 0 refills | Status: DC
Start: 2018-02-25 — End: 2018-02-25

## 2018-02-25 MED ORDER — AMPHETAMINE-DEXTROAMPHET ER 20 MG PO CP24: 40.0000 mg | ORAL_CAPSULE | Freq: Every day | ORAL | 0 refills | Status: DC

## 2018-02-25 NOTE — Telephone Encounter (Signed)
Last OV 09/06/17 last filled today

## 2018-02-25 NOTE — Telephone Encounter (Addendum)
Patient advised scripts ready, and must keep appointment, patient verbalized understanding, scripts  placed up front for pick up.

## 2018-02-25 NOTE — Telephone Encounter (Signed)
Reordered 3 months; she must keep her appt however out of courtesy, I refilled for 3 months She may pick up   I looked up patient on [place] Controlled Substances Reporting System and saw no activity that raised concern of inappropriate use.

## 2018-02-25 NOTE — Addendum Note (Signed)
Addended by: Allegra GranaARNETT, Alyssamarie Mounsey G on: 02/25/2018 09:27 AM   Modules accepted: Orders

## 2018-03-07 ENCOUNTER — Encounter: Payer: Self-pay | Admitting: Family

## 2018-03-07 ENCOUNTER — Ambulatory Visit (INDEPENDENT_AMBULATORY_CARE_PROVIDER_SITE_OTHER): Payer: 59 | Admitting: Family

## 2018-03-07 VITALS — BP 130/90 | HR 94 | Temp 98.2°F | Resp 15 | Wt 166.0 lb

## 2018-03-07 DIAGNOSIS — F4320 Adjustment disorder, unspecified: Secondary | ICD-10-CM | POA: Insufficient documentation

## 2018-03-07 DIAGNOSIS — F339 Major depressive disorder, recurrent, unspecified: Secondary | ICD-10-CM | POA: Diagnosis not present

## 2018-03-07 DIAGNOSIS — F902 Attention-deficit hyperactivity disorder, combined type: Secondary | ICD-10-CM | POA: Diagnosis not present

## 2018-03-07 DIAGNOSIS — F4321 Adjustment disorder with depressed mood: Secondary | ICD-10-CM | POA: Insufficient documentation

## 2018-03-07 MED ORDER — SERTRALINE HCL 25 MG PO TABS: 25.0000 mg | ORAL_TABLET | Freq: Every day | ORAL | 1 refills | Status: DC

## 2018-03-07 NOTE — Assessment & Plan Note (Signed)
Worsening of late.  Patient agreeable to start medication.  Will start low-dose SSRI.  Patient politely declines counseling today.  Long discussion regarding underlying depression and also ADHD.  At this time, it does not appear that her Adderall is making symptoms worse although, we are not completely sure of this. 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  We will continue with close follow-up and titration of Zoloft.  Patient will come back in 6 weeks.

## 2018-03-07 NOTE — Patient Instructions (Signed)
Trial of zoloft  Our hope is for gradual improvement of mood since starting medication; however this may take several weeks.   If you start to have unusual thoughts, thoughts of hurting yourself, or anyone else, please go immediately to the emergency department.     National Suicide Prevention Hotline - available 24 hours a day, 7 days a week.  [phone]  Major Depressive Disorder Major depressive disorder is a mental illness. It also may be called clinical depression or unipolar depression. Major depressive disorder usually causes feelings of sadness, hopelessness, or helplessness. Some people with this disorder do not feel particularly sad but lose interest in doing things they used to enjoy (anhedonia). Major depressive disorder also can cause physical symptoms. It can interfere with work, school, relationships, and other normal everyday activities. The disorder varies in severity but is longer lasting and more serious than the sadness we all feel from time to time in our lives. Major depressive disorder often is triggered by stressful life events or major life changes. Examples of these triggers include divorce, loss of your job or home, a move, and the death of a family member or close friend. Sometimes this disorder occurs for no obvious reason at all. People who have family members with major depressive disorder or bipolar disorder are at higher risk for developing this disorder, with or without life stressors. Major depressive disorder can occur at any age. It may occur just once in your life (single episode major depressive disorder). It may occur multiple times (recurrent major depressive disorder). SYMPTOMS People with major depressive disorder have either anhedonia or depressed mood on nearly a daily basis for at least 2 weeks or longer. Symptoms of depressed mood include:  Feelings of sadness (blue or down in the dumps) or emptiness.  Feelings of hopelessness or  helplessness.  Tearfulness or episodes of crying (may be observed by others).  Irritability (children and adolescents). In addition to depressed mood or anhedonia or both, people with this disorder have at least four of the following symptoms:  Difficulty sleeping or sleeping too much.   Significant change (increase or decrease) in appetite or weight.   Lack of energy or motivation.  Feelings of guilt and worthlessness.   Difficulty concentrating, remembering, or making decisions.  Unusually slow movement (psychomotor retardation) or restlessness (as observed by others).   Recurrent wishes for death, recurrent thoughts of self-harm (suicide), or a suicide attempt. People with major depressive disorder commonly have persistent negative thoughts about themselves, other people, and the world. People with severe major depressive disorder may experiencedistorted beliefs or perceptions about the world (psychotic delusions). They also may see or hear things that are not real (psychotic hallucinations). DIAGNOSIS Major depressive disorder is diagnosed through an assessment by your health care provider. Your health care provider will ask aboutaspects of your daily life, such as mood,sleep, and appetite, to see if you have the diagnostic symptoms of major depressive disorder. Your health care provider may ask about your medical history and use of alcohol or drugs, including prescription medicines. Your health care provider also may do a physical exam and blood work. This is because certain medical conditions and the use of certain substances can cause major depressive disorder-like symptoms (secondary depression). Your health care provider also may refer you to a mental health specialist for further evaluation and treatment. TREATMENT It is important to recognize the symptoms of major depressive disorder and seek treatment. The following treatments can be prescribed for this disorder:  Medicine. Antidepressant medicines usually are prescribed. Antidepressant medicines are thought to correct chemical imbalances in the brain that are commonly associated with major depressive disorder. Other types of medicine may be added if the symptoms do not respond to antidepressant medicines alone or if psychotic delusions or hallucinations occur.  Talk therapy. Talk therapy can be helpful in treating major depressive disorder by providing support, education, and guidance. Certain types of talk therapy also can help with negative thinking (cognitive behavioral therapy) and with relationship issues that trigger this disorder (interpersonal therapy). A mental health specialist can help determine which treatment is best for you. Most people with major depressive disorder do well with a combination of medicine and talk therapy. Treatments involving electrical stimulation of the brain can be used in situations with extremely severe symptoms or when medicine and talk therapy do not work over time. These treatments include electroconvulsive therapy, transcranial magnetic stimulation, and vagal nerve stimulation.   This information is not intended to replace advice given to you by your health care provider. Make sure you discuss any questions you have with your health care provider.   Document Released: 03/06/2013 Document Revised: 11/30/2014 Document Reviewed: 03/06/2013 Elsevier Interactive Patient Education [employer].

## 2018-03-07 NOTE — Progress Notes (Signed)
Subjective:    Patient ID: Megan Tucker, female    DOB: 02/08/1986, 32 y.o.   MRN: [id number]  CC: Megan NineKara M Tucker is a 32 y.o. female who presents today for follow up.   HPI: Here with son today.   ADHD- doesn't like to take the adderall at home, takes at work to help with focus. Sometimes adderall makes her focus on negative or sad things. Feels grumpier than usual. Nothing has really changed- life is good at home. Feels depressed when watching the news, and all the sadness in the world.   Does art crafts to help with mood. Had tried to take adderall 20mg  in past which didn't work well.   Not sleeping well, not because so excited but notes focuses on 3 children, and then wants time for herself. No trouble sleeping once decides to go to sleep.   No anxiety.   Lives for her children. No si/hi  Always had depression. Not a lot of family around though very close to her sister.   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   Family h/o Bipolar , addiction       HISTORY:  Past Medical History:  Diagnosis Date  . Anemia   . Anemia   . Gestational diabetes    Past Surgical History:  Procedure Laterality Date  . MOUTH SURGERY    . TUBAL LIGATION  2014   Family History  Problem Relation Age of Onset  . Drug abuse Mother        narcotic addition  . Stroke Mother   . Hypertension Mother   . Mental illness Mother   . Diabetes Mother   . Diabetes Father   . Hypertension Maternal Grandmother   . Diabetes Maternal Grandmother   . Diabetes Maternal Grandfather   . Diabetes Paternal Grandmother   . Diabetes Paternal Grandfather   . Other Neg Hx     Allergies: Adhesive [tape] Current Outpatient Medications on File Prior to Visit  Medication Sig Dispense Refill  . amphetamine-dextroamphetamine (ADDERALL XR) 20 MG 24 hr capsule Take 2 capsules (40 mg total) by mouth daily. 60 capsule 0   No current facility-administered medications on file  prior to visit.     Social History   Tobacco Use  . Smoking status: Former Games developermoker  . Smokeless tobacco: Never Used  Substance Use Topics  . Alcohol use: Yes    Alcohol/week: 0.0 - 0.6 oz  . Drug use: No    Review of Systems  Constitutional: Negative for chills and fever.  Respiratory: Negative for cough.   Cardiovascular: Negative for chest pain and palpitations.  Gastrointestinal: Negative for nausea and vomiting.  Psychiatric/Behavioral: Negative for sleep disturbance and suicidal ideas. The patient is not nervous/anxious.       Objective:    BP 130/90 (BP Location: Left Arm, Patient Position: Sitting, Cuff Size: Normal)   Pulse 94   Temp 98.2 F (36.8 C) (Oral)   Resp 15   Wt 166 lb (75.3 kg)   SpO2 98%   BMI 28.49 kg/m  BP Readings from Last 3 Encounters:  03/07/18 130/90  09/06/17 134/90  08/28/16 132/78   Wt Readings from Last 3 Encounters:  03/07/18 166 lb (75.3 kg)  09/06/17 150 lb 3.2 oz (68.1 kg)  08/28/16 154 lb 4 oz (70 kg)    Physical Exam  Constitutional: She appears well-developed and well-nourished.  Eyes: Conjunctivae are normal.  Cardiovascular:  Normal rate, regular rhythm, normal heart sounds and normal pulses.  Pulmonary/Chest: Effort normal and breath sounds normal. She has no wheezes. She has no rhonchi. She has no rales.  Neurological: She is alert.  Skin: Skin is warm and dry.  Psychiatric: She has a normal mood and affect. Her speech is normal and behavior is normal. Thought content normal.  Vitals reviewed.      Assessment & Plan:   Problem List Items Addressed This Visit      Other   Attention deficit hyperactivity disorder (ADHD)    We will continue Adderall for now.  See note on depression.      Depression, recurrent (HCC) - Primary    Worsening of late.  Patient agreeable to start medication.  Will start low-dose SSRI.  Patient politely declines counseling today.  Long discussion regarding underlying depression and also  ADHD.  At this time, it does not appear that her Adderall is making symptoms worse although, we are not completely sure of this. 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  We will continue with close follow-up and titration of Zoloft.  Patient will come back in 6 weeks.      Relevant Medications   sertraline (ZOLOFT) 25 MG tablet       I am having Megan Tucker start on sertraline. I am also having her maintain her amphetamine-dextroamphetamine.   Meds ordered this encounter  Medications  . sertraline (ZOLOFT) 25 MG tablet    Sig: Take 1 tablet (25 mg total) by mouth daily.    Dispense:  90 tablet    Refill:  1    Order Specific Question:   Supervising Provider    Answer:   Sherlene Shams [[id number]]    Return precautions given.   Risks, benefits, and alternatives of the medications and treatment plan prescribed today were discussed, and patient expressed understanding.   Education regarding symptom management and diagnosis given to patient on AVS.  Continue to follow with Allegra Grana, FNP for routine health maintenance.   Megan Tucker and I agreed with plan.   Rennie Plowman, FNP I spent 25 min face to face w/ pt. Of which greater than 50% of time was spent Discussion of depression, focus, irritability and methods for treatment including medication, counseling.

## 2018-03-07 NOTE — Assessment & Plan Note (Signed)
We will continue Adderall for now.  See note on depression.

## 2018-05-03 ENCOUNTER — Encounter: Payer: Self-pay | Admitting: Family

## 2018-05-04 NOTE — Progress Notes (Addendum)
Subjective:    Patient ID: Megan Tucker, female    DOB: 1986-08-12, 32 y.o.   MRN: [id number]  HPI  [name] is a 32 year old female who presents today with vaginal discharge that has been present for 2  to 3 days. She reports recent new sexual partner and LMP was 04/26/18.   Partners: One New partners: One Multiple partners: No  Practices: Sexual intercourse: vaginal intercourse  Practices: Condom Use: No  Past History: History of STIs: BV   Vaginal discharge is described as thin, white/gray Associated symptoms: mild vaginal irritatation Dysuria: No Dyspareunia: No Pruritis: Mild pruritus Burning: Not significant; mild irritation noted above.  Odor: mild odor noted per patient  She is seeking STI screening today and states that she is "very anxious" because she regrets that her partner did not use a condom and is concerned that she may have been exposed to an infection.    Review of Systems  Constitutional: Negative for chills, fatigue and fever.  Respiratory: Negative for cough, shortness of breath and wheezing.   Cardiovascular: Negative for chest pain and palpitations.  Gastrointestinal: Negative for abdominal pain.  Genitourinary: Positive for vaginal discharge. Negative for dysuria, frequency, genital sores, hematuria and pelvic pain.       Vaginal irritation  Skin: Negative for rash.   Past Medical History:  Diagnosis Date  . Anemia   . Anemia   . Gestational diabetes      Social History   Socioeconomic History  . Marital status: Single    Spouse name: Not on file  . Number of children: Not on file  . Years of education: Not on file  . Highest education level: Not on file  Occupational History  . Not on file  Social Needs  . Financial resource strain: Not on file  . Food insecurity:    Worry: Not on file    Inability: Not on file  . Transportation needs:    Medical: Not on file    Non-medical: Not on file  Tobacco Use  . Smoking status:  Former Games developer  . Smokeless tobacco: Never Used  Substance and Sexual Activity  . Alcohol use: Yes    Alcohol/week: 0.0 - 0.6 oz  . Drug use: No  . Sexual activity: Yes  Lifestyle  . Physical activity:    Days per week: Not on file    Minutes per session: Not on file  . Stress: Not on file  Relationships  . Social connections:    Talks on phone: Not on file    Gets together: Not on file    Attends religious service: Not on file    Active member of club or organization: Not on file    Attends meetings of clubs or organizations: Not on file    Relationship status: Not on file  . Intimate partner violence:    Fear of current or ex partner: Not on file    Emotionally abused: Not on file    Physically abused: Not on file    Forced sexual activity: Not on file  Other Topics Concern  . Not on file  Social History Narrative   3 boys      Single mom- however father of children are very close, involved.       Nurse Tech ICU             Past Surgical History:  Procedure Laterality Date  . MOUTH SURGERY    . TUBAL LIGATION  2014    Family History  Problem Relation Age of Onset  . Drug abuse Mother        narcotic addition  . Stroke Mother   . Hypertension Mother   . Mental illness Mother   . Diabetes Mother   . Diabetes Father   . Hypertension Maternal Grandmother   . Diabetes Maternal Grandmother   . Diabetes Maternal Grandfather   . Diabetes Paternal Grandmother   . Diabetes Paternal Grandfather   . Other Neg Hx     Allergies  Allergen Reactions  . Adhesive [Tape] Rash    Current Outpatient Medications on File Prior to Visit  Medication Sig Dispense Refill  . amphetamine-dextroamphetamine (ADDERALL XR) 20 MG 24 hr capsule Take 2 capsules (40 mg total) by mouth daily. 60 capsule 0  . sertraline (ZOLOFT) 25 MG tablet Take 1 tablet (25 mg total) by mouth daily. 90 tablet 1   No current facility-administered medications on file prior to visit.     BP 138/88    Pulse 97   Temp 98.4 F (36.9 C) (Oral)   LMP 04/26/2018       Objective:   Physical Exam  Constitutional: She is oriented to person, place, and time. She appears well-developed and well-nourished.  Eyes: Pupils are equal, round, and reactive to light. No scleral icterus.  Neck: Neck supple.  Cardiovascular: Normal rate and regular rhythm.  Pulmonary/Chest: Effort normal and breath sounds normal. She has no wheezes. She has no rales.  Abdominal: Soft. Bowel sounds are normal. There is no tenderness.  Genitourinary: Pelvic exam was performed with patient supine. There is no lesion on the right labia. There is no lesion on the left labia.  Genitourinary Comments: Wet prep obtained; thin, white discharge present. No erythema or bleeding present.  External genitalia: Normal in appearance  Lymphadenopathy:    She has no cervical adenopathy.  Neurological: She is alert and oriented to person, place, and time.  Skin: Skin is warm and dry.  Psychiatric: She has a normal mood and affect. Her behavior is normal. Judgment and thought content normal.      Assessment & Plan:  1. Routine screening for STI (sexually transmitted infection) Prior history of BV in 2013 present. We reviewed STI testing and safe sex practices. Patient was extremely anxious regarding recent unprotected sex. Vaginal discharge also noted after recent intercourse. She was also concerned that she may have been exposed to an infection so we reviewed empiric treatment today. While testing was obtained via wet prep and lab work with her concerns, we opted to empirically treat for gonorrhea and chlamydia today. Ceftriaxone 250 mg IM and azithromycin 1 gram was provided.   - RPR - Hepatitis B surface antibody; Future - HIV antibody (with reflex) - Hepatitis B surface antibody  2. Vaginal discharge Wet prep obtained. Thin, white discharge present with pelvic exam. Testing for GC/Chlamydia, trichomonas, Bvag, and Cvag. We  discussed working diagnoses of BV vs. Candidiasis. Further treatment will be determined after results are obtained. We discussed avoidance of sexual activity until symptoms resolve as she also requested empiric treatment for gonorrhea and chlamydia today.   - Cervicovaginal ancillary only  Chaperone present  Roddie McJulia Tanner Yeley, FNP-C

## 2018-05-05 ENCOUNTER — Telehealth: Payer: Self-pay | Admitting: Family

## 2018-05-05 ENCOUNTER — Other Ambulatory Visit (HOSPITAL_COMMUNITY)
Admission: RE | Admit: 2018-05-05 | Discharge: 2018-05-05 | Disposition: A | Discharge status: Home / Self Care | Payer: 59 | Source: Ambulatory Visit | Attending: Family Medicine | Admitting: Family Medicine

## 2018-05-05 ENCOUNTER — Ambulatory Visit (INDEPENDENT_AMBULATORY_CARE_PROVIDER_SITE_OTHER): Payer: 59 | Admitting: Family Medicine

## 2018-05-05 ENCOUNTER — Encounter: Payer: Self-pay | Admitting: Family Medicine

## 2018-05-05 VITALS — BP 138/88 | HR 97 | Temp 98.4°F

## 2018-05-05 DIAGNOSIS — N898 Other specified noninflammatory disorders of vagina: Secondary | ICD-10-CM | POA: Diagnosis not present

## 2018-05-05 DIAGNOSIS — Z113 Encounter for screening for infections with a predominantly sexual mode of transmission: Secondary | ICD-10-CM | POA: Diagnosis not present

## 2018-05-05 DIAGNOSIS — B9689 Other specified bacterial agents as the cause of diseases classified elsewhere: Secondary | ICD-10-CM

## 2018-05-05 DIAGNOSIS — N760 Acute vaginitis: Principal | ICD-10-CM

## 2018-05-05 MED ORDER — CEFTRIAXONE SODIUM 500 MG IJ SOLR: 500.0000 mg | Freq: Once | INTRAMUSCULAR | Status: DC

## 2018-05-05 MED ORDER — AZITHROMYCIN 500 MG PO TABS: 500.0000 mg | ORAL_TABLET | Freq: Every day | ORAL | 0 refills | Status: DC

## 2018-05-05 MED ORDER — CEFTRIAXONE SODIUM 500 MG IJ SOLR
500.0000 mg | Freq: Once | INTRAMUSCULAR | Status: AC
  Administered 2018-05-05: 250 mg via INTRAMUSCULAR

## 2018-05-05 NOTE — Telephone Encounter (Signed)
Advised patient she is to start antibiotic.

## 2018-05-05 NOTE — Telephone Encounter (Signed)
Please advise 

## 2018-05-05 NOTE — Telephone Encounter (Signed)
Copied from CRM [phone]#[id number]. Topic: Quick Communication - See Telephone Encounter >> May 05, 2018 12:45 PM Rudi CocoLathan, Jerah Esty M, NT wrote: CRM for notification. See Telephone encounter for: 05/05/18.  Pt. Calling to see if its ok to take antibiotic with injection that was also received today. Called office 3x no answer. Pt. Can be reached at [phone]

## 2018-05-05 NOTE — Patient Instructions (Signed)
Please go to the lab before you leave.  We will contact you the results of your lab work.  Please follow up for routine care with your provider.

## 2018-05-06 LAB — CERVICOVAGINAL ANCILLARY ONLY
Bacterial vaginitis: POSITIVE — AB
CHLAMYDIA, DNA PROBE: NEGATIVE
Candida vaginitis: NEGATIVE
NEISSERIA GONORRHEA: NEGATIVE
Trichomonas: NEGATIVE

## 2018-05-06 LAB — HEPATITIS B SURFACE ANTIBODY,QUALITATIVE: HEP B S AB: REACTIVE — AB

## 2018-05-06 LAB — RPR: RPR Ser Ql: NONREACTIVE

## 2018-05-06 LAB — HIV ANTIBODY (ROUTINE TESTING W REFLEX): HIV 1&2 Ab, 4th Generation: NONREACTIVE

## 2018-05-07 MED ORDER — METRONIDAZOLE 500 MG PO TABS: 500.0000 mg | ORAL_TABLET | Freq: Two times a day (BID) | ORAL | 0 refills | Status: DC

## 2018-05-07 NOTE — Telephone Encounter (Signed)
Contacted patient by phone to review lab results.  Lab results are positive for BV; metronidazole 500 mg BID x 7 days sent to pharmacy. Advised patient to avoid alcohol while taking this medication and avoidance of sexual activity until symptoms resolve. Further advised her to follow up if no improvement.  Results are positive for Hep B antibody. Patient has been immunized with the Hep B series as she works in Teacher, [employer]. No further testing is requested.  Patient will follow up if no improvement. She voiced understanding and agreed with plan.

## 2018-05-09 ENCOUNTER — Encounter: Payer: Self-pay | Admitting: Family

## 2018-05-09 ENCOUNTER — Ambulatory Visit (INDEPENDENT_AMBULATORY_CARE_PROVIDER_SITE_OTHER): Payer: 59 | Admitting: Family

## 2018-05-09 VITALS — BP 126/92 | HR 91 | Temp 98.1°F | Wt 172.2 lb

## 2018-05-09 DIAGNOSIS — F902 Attention-deficit hyperactivity disorder, combined type: Secondary | ICD-10-CM | POA: Diagnosis not present

## 2018-05-09 DIAGNOSIS — R030 Elevated blood-pressure reading, without diagnosis of hypertension: Secondary | ICD-10-CM | POA: Diagnosis not present

## 2018-05-09 DIAGNOSIS — F339 Major depressive disorder, recurrent, unspecified: Secondary | ICD-10-CM | POA: Diagnosis not present

## 2018-05-09 DIAGNOSIS — I10 Essential (primary) hypertension: Secondary | ICD-10-CM | POA: Insufficient documentation

## 2018-05-09 MED ORDER — SERTRALINE HCL 50 MG PO TABS: 50.0000 mg | ORAL_TABLET | Freq: Every day | ORAL | 1 refills | Status: DC

## 2018-05-09 NOTE — Assessment & Plan Note (Signed)
Improved. Increase from 25mg  zoloft to 50mg . Patient will let me know how she is doing

## 2018-05-09 NOTE — Progress Notes (Signed)
Subjective:    Patient ID: Megan Tucker, female    DOB: 09/02/1986, 32 y.o.   MRN: [id number]  CC: Megan Tucker is a 32 y.o. female who presents today for follow up.   HPI: Depression- improved. Would like to try increased dose.taking zoloft in morning which works well. Doesn't feel the dread she had felt. No si/hi Son with her today  ADHD- doing well on dose of adderall. No increased anxiety, trouble sleeping  Due for pap smear.  HISTORY:  Past Medical History:  Diagnosis Date  . Anemia   . Anemia   . Gestational diabetes    Past Surgical History:  Procedure Laterality Date  . MOUTH SURGERY    . TUBAL LIGATION  2014   Family History  Problem Relation Age of Onset  . Drug abuse Mother        narcotic addition  . Stroke Mother   . Hypertension Mother   . Mental illness Mother   . Diabetes Mother   . Diabetes Father   . Hypertension Maternal Grandmother   . Diabetes Maternal Grandmother   . Diabetes Maternal Grandfather   . Diabetes Paternal Grandmother   . Diabetes Paternal Grandfather   . Other Neg Hx     Allergies: Adhesive [tape] Current Outpatient Medications on File Prior to Visit  Medication Sig Dispense Refill  . amphetamine-dextroamphetamine (ADDERALL XR) 20 MG 24 hr capsule Take 2 capsules (40 mg total) by mouth daily. 60 capsule 0  . metroNIDAZOLE (FLAGYL) 500 MG tablet Take 1 tablet (500 mg total) by mouth 2 (two) times daily. 14 tablet 0   No current facility-administered medications on file prior to visit.     Social History   Tobacco Use  . Smoking status: Former Games developermoker  . Smokeless tobacco: Never Used  Substance Use Topics  . Alcohol use: Yes    Alcohol/week: 0.0 - 0.6 oz  . Drug use: No    Review of Systems  Constitutional: Negative for chills and fever.  Respiratory: Negative for cough.   Cardiovascular: Negative for chest pain and palpitations.  Gastrointestinal: Negative for nausea and vomiting.  Psychiatric/Behavioral: Negative  for sleep disturbance and suicidal ideas. The patient is not nervous/anxious.       Objective:    BP (!) 126/92   Pulse 91   Temp 98.1 F (36.7 C) (Oral)   Wt 172 lb 4 oz (78.1 kg)   LMP 04/26/2018   SpO2 98%   BMI 29.57 kg/m  BP Readings from Last 3 Encounters:  05/09/18 (!) 126/92  05/05/18 138/88  03/07/18 130/90   Wt Readings from Last 3 Encounters:  05/09/18 172 lb 4 oz (78.1 kg)  03/07/18 166 lb (75.3 kg)  09/06/17 150 lb 3.2 oz (68.1 kg)    Physical Exam  Constitutional: She appears well-developed and well-nourished.  Eyes: Conjunctivae are normal.  Cardiovascular: Normal rate, regular rhythm, normal heart sounds and normal pulses.  Pulmonary/Chest: Effort normal and breath sounds normal. She has no wheezes. She has no rhonchi. She has no rales.  Neurological: She is alert.  Skin: Skin is warm and dry.  Psychiatric: She has a normal mood and affect. Her speech is normal and behavior is normal. Thought content normal.  Vitals reviewed.      Assessment & Plan:   Problem List Items Addressed This Visit      Other   Attention deficit hyperactivity disorder (ADHD)    Stable. Continue.      Depression,  recurrent (HCC) - Primary    Improved. Increase from 25mg  zoloft to 50mg . Patient will let me know how she is doing      Relevant Medications   sertraline (ZOLOFT) 50 MG tablet   Elevated blood pressure reading    Diastolic slightly increased, which has been her trend.  Advised patient to monitor and she will let me know if persistent.          I have discontinued Megan Tucker "Louis Rawling"'s azithromycin. I have also changed her sertraline. Additionally, I am having her maintain her amphetamine-dextroamphetamine and metroNIDAZOLE.   Meds ordered this encounter  Medications  . sertraline (ZOLOFT) 50 MG tablet    Sig: Take 1 tablet (50 mg total) by mouth daily.    Dispense:  90 tablet    Refill:  1    Order Specific Question:   Supervising Provider     Answer:   Sherlene Shams [[id number]]    Return precautions given.   Risks, benefits, and alternatives of the medications and treatment plan prescribed today were discussed, and patient expressed understanding.   Education regarding symptom management and diagnosis given to patient on AVS.  Continue to follow with Allegra Grana, FNP for routine health maintenance.   Megan Tucker and I agreed with plan.   Rennie Plowman, FNP

## 2018-05-09 NOTE — Assessment & Plan Note (Addendum)
Diastolic slightly increased, which has been her trend.  Advised patient to monitor and she will let me know if persistent.

## 2018-05-09 NOTE — Patient Instructions (Signed)
So happy you are doing great.   Come back in 4ish months.

## 2018-05-09 NOTE — Assessment & Plan Note (Signed)
Stable. Continue.

## 2018-05-11 ENCOUNTER — Encounter: Payer: Self-pay | Admitting: Family

## 2018-05-11 ENCOUNTER — Other Ambulatory Visit: Payer: Self-pay | Admitting: Family

## 2018-05-11 DIAGNOSIS — R030 Elevated blood-pressure reading, without diagnosis of hypertension: Secondary | ICD-10-CM

## 2018-05-11 MED ORDER — AMLODIPINE BESYLATE 5 MG PO TABS: 5.0000 mg | ORAL_TABLET | Freq: Every day | ORAL | 3 refills | Status: DC

## 2018-05-16 ENCOUNTER — Encounter: Payer: Self-pay | Admitting: Family

## 2018-05-23 ENCOUNTER — Encounter: Payer: Self-pay | Admitting: Family

## 2018-05-23 ENCOUNTER — Ambulatory Visit (INDEPENDENT_AMBULATORY_CARE_PROVIDER_SITE_OTHER): Payer: 59 | Admitting: Family

## 2018-05-23 ENCOUNTER — Other Ambulatory Visit (HOSPITAL_COMMUNITY)
Admission: RE | Admit: 2018-05-23 | Discharge: 2018-05-23 | Disposition: A | Discharge status: Home / Self Care | Payer: 59 | Source: Ambulatory Visit | Attending: Family | Admitting: Family

## 2018-05-23 VITALS — BP 154/100 | HR 90 | Temp 98.4°F | Ht 64.0 in | Wt 166.2 lb

## 2018-05-23 DIAGNOSIS — Z0000 Encounter for general adult medical examination without abnormal findings: Secondary | ICD-10-CM | POA: Diagnosis not present

## 2018-05-23 DIAGNOSIS — I10 Essential (primary) hypertension: Secondary | ICD-10-CM

## 2018-05-23 DIAGNOSIS — F902 Attention-deficit hyperactivity disorder, combined type: Secondary | ICD-10-CM

## 2018-05-23 DIAGNOSIS — F339 Major depressive disorder, recurrent, unspecified: Secondary | ICD-10-CM | POA: Diagnosis not present

## 2018-05-23 MED ORDER — LISINOPRIL 5 MG PO TABS: 5.0000 mg | ORAL_TABLET | Freq: Every day | ORAL | 3 refills | Status: DC

## 2018-05-23 NOTE — Assessment & Plan Note (Addendum)
Clinical breast exam performed.  Normal pelvic exam.  Discussed with patient if menses remain irregular, to notify me so we will pursue further evaluation.  I am reassured that this is only happened past month, and no pattern.  Will await Pap.  Note: We discussed relevant and past history of abnormal labs today at length, we decided to order appropriate labs for screening today based on this discussion. Patient was comfortable with this. Patient was advised if any symptoms were to change after today's visit, to notify me as we may always order additional labs.

## 2018-05-23 NOTE — Progress Notes (Signed)
Subjective:    Patient ID: Megan Tucker, female    DOB: 1986/09/02, 32 y.o.   MRN: [id number]  CC: Megan Tucker is a 32 y.o. female who presents today for physical exam.    HPI: Irregular menses. Had two cycles this month for 6 days each. First time has ever happened. No significant life changes- exercise, diet. Some stress with worrying about blood pressure. Describes as heavy and moderate menstrual cramping, worse on left side. Felt dizzy at the time, 'always feels that way during a period.' Dizziness resolved.   No fatigue. No history vitamin D deficiency or thyroid disease.   HTN- Over weekend had 143/82 and felt like medication were working. on amlodipine 10mg . Hasnt taken today as started to notice non itchy rash on thighs which lasts for 30 miuntes and then resolves.   Denies exertional chest pain or pressure, numbness or tingling radiating to left arm or jaw, palpitations, dizziness, frequent headaches, changes in vision, or shortness of breath.    ADHD- doing well on adderall.   Depression- feels well on 50mg  zoloft.      Colorectal Cancer Screening: no early family history Breast Cancer Screening: no early family history Cervical Cancer Screening: due Bone Health screening/DEXA for 65+: No increased fracture risk. Defer screening at this time Lung Cancer Screening: Doesn't have 30 year pack year history and age > 55 years.       Tetanus - utd         Labs: Screening labs today. Exercise: Gets regular exercise.  Alcohol use: occasional Smoking/tobacco use: former smoker.  Wears seat belt: Yes. Skin: no personal skin cancer or changes to skin noted.    HISTORY:  Past Medical History:  Diagnosis Date  . Anemia   . Anemia   . Gestational diabetes     Past Surgical History:  Procedure Laterality Date  . MOUTH SURGERY    . TUBAL LIGATION  2014   Family History  Problem Relation Age of Onset  . Drug abuse Mother        narcotic addition  . Stroke Mother     . Hypertension Mother   . Mental illness Mother   . Diabetes Mother   . Diabetes Father   . Hypertension Maternal Grandmother   . Diabetes Maternal Grandmother   . Diabetes Maternal Grandfather   . Diabetes Paternal Grandmother   . Diabetes Paternal Grandfather   . Other Neg Hx       ALLERGIES: Adhesive [tape]  Current Outpatient Medications on File Prior to Visit  Medication Sig Dispense Refill  . amphetamine-dextroamphetamine (ADDERALL XR) 20 MG 24 hr capsule Take 2 capsules (40 mg total) by mouth daily. 60 capsule 0  . sertraline (ZOLOFT) 50 MG tablet Take 1 tablet (50 mg total) by mouth daily. 90 tablet 1   No current facility-administered medications on file prior to visit.     Social History   Tobacco Use  . Smoking status: Former Games developer  . Smokeless tobacco: Never Used  Substance Use Topics  . Alcohol use: Yes    Alcohol/week: 0.0 - 0.6 oz    Comment: glass of wine socially; once per month  . Drug use: No    Review of Systems  Constitutional: Negative for chills, fever and unexpected weight change.  HENT: Negative for congestion.   Respiratory: Negative for cough.   Cardiovascular: Negative for chest pain, palpitations and leg swelling.  Gastrointestinal: Negative for nausea and vomiting.  Genitourinary: Negative for  dysuria, vaginal bleeding (resolved) and vaginal pain.  Musculoskeletal: Negative for arthralgias and myalgias.  Skin: Positive for rash (resolved).  Neurological: Negative for headaches.  Hematological: Negative for adenopathy.  Psychiatric/Behavioral: Negative for confusion.      Objective:    BP (!) 154/100 (BP Location: Left Arm, Patient Position: Sitting)   Pulse 90   Temp 98.4 F (36.9 C) (Oral)   Ht 5\' 4"  (1.626 m)   Wt 166 lb 4 oz (75.4 kg)   LMP 05/14/2018   SpO2 98%   BMI 28.54 kg/m   BP Readings from Last 3 Encounters:  05/23/18 (!) 154/100  05/09/18 (!) 126/92  05/05/18 138/88   Wt Readings from Last 3 Encounters:   05/23/18 166 lb 4 oz (75.4 kg)  05/09/18 172 lb 4 oz (78.1 kg)  03/07/18 166 lb (75.3 kg)    Physical Exam  Constitutional: She appears well-developed and well-nourished.  Eyes: Conjunctivae are normal.  Neck: No thyroid mass and no thyromegaly present.  Cardiovascular: Normal rate, regular rhythm, normal heart sounds and normal pulses.  Pulmonary/Chest: Effort normal and breath sounds normal. She has no wheezes. She has no rhonchi. She has no rales. Right breast exhibits no inverted nipple, no mass, no nipple discharge, no skin change and no tenderness. Left breast exhibits no inverted nipple, no mass, no nipple discharge, no skin change and no tenderness. Breasts are symmetrical.  No masses or asymmetry appreciated during CBE.  Genitourinary: Uterus is not enlarged, not fixed and not tender. Cervix exhibits no motion tenderness, no discharge and no friability. Right adnexum displays no mass, no tenderness and no fullness. Left adnexum displays no mass, no tenderness and no fullness.  Genitourinary Comments: Pap performed. NO pain, asymmetry, mass on left side of pelvis appreciated with bimanual exam.  No CMT. Unable to appreciated ovaries.  Lymphadenopathy:       Head (right side): No submental, no submandibular, no tonsillar, no preauricular, no posterior auricular and no occipital adenopathy present.       Head (left side): No submental, no submandibular, no tonsillar, no preauricular, no posterior auricular and no occipital adenopathy present.       Right cervical: No superficial cervical, no deep cervical and no posterior cervical adenopathy present.      Left cervical: No superficial cervical, no deep cervical and no posterior cervical adenopathy present.    She has no axillary adenopathy.       Right axillary: No pectoral and no lateral adenopathy present.       Left axillary: No pectoral and no lateral adenopathy present. Neurological: She is alert.  Skin: Skin is warm and dry.   Psychiatric: She has a normal mood and affect. Her speech is normal and behavior is normal. Thought content normal.  Vitals reviewed.      Assessment & Plan:   Problem List Items Addressed This Visit      Cardiovascular and Mediastinum   HTN (hypertension)    Elevated today.  Patient stopped amlodipine due to rash.  Explained to her that in less than 1% population can have rash.  In this context is onset correlates with starting new medication, we will stop amlodipine and start lisinopril.  Patient will keep blood pressure log and send these to me.  Will follow.      Relevant Medications   lisinopril (PRINIVIL,ZESTRIL) 5 MG tablet   Other Relevant Orders   Comprehensive metabolic panel     Other   Routine physical examination - Primary  Clinical breast exam performed.  Normal pelvic exam.  Discussed with patient if menses remain irregular, to notify me so we will pursue further evaluation.  I am reassured that this is only happened past month, and no pattern.  Will await Pap.  Note: We discussed relevant and past history of abnormal labs today at length, we decided to order appropriate labs for screening today based on this discussion. Patient was comfortable with this. Patient was advised if any symptoms were to change after today's visit, to notify me as we may always order additional labs.       Relevant Orders   Comprehensive metabolic panel   Cytology - PAP   CBC with Differential/Platelet   Hemoglobin A1c   Attention deficit hyperactivity disorder (ADHD)    Stable.  Continue current regimen      Depression, recurrent (HCC)    Stable.  Continue current regimen.          I have discontinued Tyrone NineKara M. Pendry "Antia Gewirtz"'s metroNIDAZOLE and amLODipine. I am also having her start on lisinopril. Additionally, I am having her maintain her amphetamine-dextroamphetamine and sertraline.   Meds ordered this encounter  Medications  . lisinopril (PRINIVIL,ZESTRIL) 5 MG  tablet    Sig: Take 1 tablet (5 mg total) by mouth daily.    Dispense:  90 tablet    Refill:  3    Order Specific Question:   Supervising Provider    Answer:   Sherlene ShamsULLO, TERESA L [[id number]]    Return precautions given.   Risks, benefits, and alternatives of the medications and treatment plan prescribed today were discussed, and patient expressed understanding.   Education regarding symptom management and diagnosis given to patient on AVS.   Continue to follow with Allegra GranaArnett, Zarria Towell G, FNP for routine health maintenance.   Tyrone NineKara M Blye and I agreed with plan.   Rennie PlowmanMargaret Verlinda Slotnick, FNP

## 2018-05-23 NOTE — Assessment & Plan Note (Signed)
Stable  Continue current regimen  

## 2018-05-23 NOTE — Patient Instructions (Addendum)
Stop amlodipine and see if rash recurs.. Let me know.   Start lisinopril. Please send blood pressure reading later today. Goal < 130/80.  Let me know if menses are irregular again so we can further evaluate.    We will check labs next week - please make an appointment for approx 7 days after starting lisinopril    Health Maintenance, Female Adopting a healthy lifestyle and getting preventive care can go a long way to promote health and wellness. Talk with your health care provider about what schedule of regular examinations is right for you. This is a good chance for you to check in with your provider about disease prevention and staying healthy. In between checkups, there are plenty of things you can do on your own. Experts have done a lot of research about which lifestyle changes and preventive measures are most likely to keep you healthy. Ask your health care provider for more information. Weight and diet Eat a healthy diet  Be sure to include plenty of vegetables, fruits, low-fat dairy products, and lean protein.  Do not eat a lot of foods high in solid fats, added sugars, or salt.  Get regular exercise. This is one of the most important things you can do for your health. ? Most adults should exercise for at least 150 minutes each week. The exercise should increase your heart rate and make you sweat (moderate-intensity exercise). ? Most adults should also do strengthening exercises at least twice a week. This is in addition to the moderate-intensity exercise.  Maintain a healthy weight  Body mass index (BMI) is a measurement that can be used to identify possible weight problems. It estimates body fat based on height and weight. Your health care provider can help determine your BMI and help you achieve or maintain a healthy weight.  For females 50 years of age and older: ? A BMI below 18.5 is considered underweight. ? A BMI of 18.5 to 24.9 is normal. ? A BMI of 25 to 29.9 is  considered overweight. ? A BMI of 30 and above is considered obese.  Watch levels of cholesterol and blood lipids  You should start having your blood tested for lipids and cholesterol at 33 years of age, then have this test every 5 years.  You may need to have your cholesterol levels checked more often if: ? Your lipid or cholesterol levels are high. ? You are older than 33 years of age. ? You are at high risk for heart disease.  Cancer screening Lung Cancer  Lung cancer screening is recommended for adults 18-42 years old who are at high risk for lung cancer because of a history of smoking.  A yearly low-dose CT scan of the lungs is recommended for people who: ? Currently smoke. ? Have quit within the past 15 years. ? Have at least a 30-pack-year history of smoking. A pack year is smoking an average of one pack of cigarettes a day for 1 year.  Yearly screening should continue until it has been 15 years since you quit.  Yearly screening should stop if you develop a health problem that would prevent you from having lung cancer treatment.  Breast Cancer  Practice breast self-awareness. This means understanding how your breasts normally appear and feel.  It also means doing regular breast self-exams. Let your health care provider know about any changes, no matter how small.  If you are in your 20s or 30s, you should have a clinical breast exam (CBE)  by a health care provider every 1-3 years as part of a regular health exam.  If you are 40 or older, have a CBE every year. Also consider having a breast X-ray (mammogram) every year.  If you have a family history of breast cancer, talk to your health care provider about genetic screening.  If you are at high risk for breast cancer, talk to your health care provider about having an MRI and a mammogram every year.  Breast cancer gene (BRCA) assessment is recommended for women who have family members with BRCA-related cancers.  BRCA-related cancers include: ? Breast. ? Ovarian. ? Tubal. ? Peritoneal cancers.  Results of the assessment will determine the need for genetic counseling and BRCA1 and BRCA2 testing.  Cervical Cancer Your health care provider may recommend that you be screened regularly for cancer of the pelvic organs (ovaries, uterus, and vagina). This screening involves a pelvic examination, including checking for microscopic changes to the surface of your cervix (Pap test). You may be encouraged to have this screening done every 3 years, beginning at age 21.  For women ages 30-65, health care providers may recommend pelvic exams and Pap testing every 3 years, or they may recommend the Pap and pelvic exam, combined with testing for human papilloma virus (HPV), every 5 years. Some types of HPV increase your risk of cervical cancer. Testing for HPV may also be done on women of any age with unclear Pap test results.  Other health care providers may not recommend any screening for nonpregnant women who are considered low risk for pelvic cancer and who do not have symptoms. Ask your health care provider if a screening pelvic exam is right for you.  If you have had past treatment for cervical cancer or a condition that could lead to cancer, you need Pap tests and screening for cancer for at least 20 years after your treatment. If Pap tests have been discontinued, your risk factors (such as having a new sexual partner) need to be reassessed to determine if screening should resume. Some women have medical problems that increase the chance of getting cervical cancer. In these cases, your health care provider may recommend more frequent screening and Pap tests.  Colorectal Cancer  This type of cancer can be detected and often prevented.  Routine colorectal cancer screening usually begins at 33 years of age and continues through 33 years of age.  Your health care provider may recommend screening at an earlier age if  you have risk factors for colon cancer.  Your health care provider may also recommend using home test kits to check for hidden blood in the stool.  A small camera at the end of a tube can be used to examine your colon directly (sigmoidoscopy or colonoscopy). This is done to check for the earliest forms of colorectal cancer.  Routine screening usually begins at age 50.  Direct examination of the colon should be repeated every 5-10 years through 33 years of age. However, you may need to be screened more often if early forms of precancerous polyps or small growths are found.  Skin Cancer  Check your skin from head to toe regularly.  Tell your health care provider about any new moles or changes in moles, especially if there is a change in a mole's shape or color.  Also tell your health care provider if you have a mole that is larger than the size of a pencil eraser.  Always use sunscreen. Apply sunscreen liberally and   repeatedly throughout the day.  Protect yourself by wearing long sleeves, pants, a wide-brimmed hat, and sunglasses whenever you are outside.  Heart disease, diabetes, and high blood pressure  High blood pressure causes heart disease and increases the risk of stroke. High blood pressure is more likely to develop in: ? People who have blood pressure in the high end of the normal range (130-139/85-89 mm Hg). ? People who are overweight or obese. ? People who are African American.  If you are 18-39 years of age, have your blood pressure checked every 3-5 years. If you are 40 years of age or older, have your blood pressure checked every year. You should have your blood pressure measured twice-once when you are at a hospital or clinic, and once when you are not at a hospital or clinic. Record the average of the two measurements. To check your blood pressure when you are not at a hospital or clinic, you can use: ? An automated blood pressure machine at a pharmacy. ? A home blood  pressure monitor.  If you are between 55 years and 79 years old, ask your health care provider if you should take aspirin to prevent strokes.  Have regular diabetes screenings. This involves taking a blood sample to check your fasting blood sugar level. ? If you are at a normal weight and have a low risk for diabetes, have this test once every three years after 33 years of age. ? If you are overweight and have a high risk for diabetes, consider being tested at a younger age or more often. Preventing infection Hepatitis B  If you have a higher risk for hepatitis B, you should be screened for this virus. You are considered at high risk for hepatitis B if: ? You were born in a country where hepatitis B is common. Ask your health care provider which countries are considered high risk. ? Your parents were born in a high-risk country, and you have not been immunized against hepatitis B (hepatitis B vaccine). ? You have HIV or AIDS. ? You use needles to inject street drugs. ? You live with someone who has hepatitis B. ? You have had sex with someone who has hepatitis B. ? You get hemodialysis treatment. ? You take certain medicines for conditions, including cancer, organ transplantation, and autoimmune conditions.  Hepatitis C  Blood testing is recommended for: ? Everyone born from 1945 through 1965. ? Anyone with known risk factors for hepatitis C.  Sexually transmitted infections (STIs)  You should be screened for sexually transmitted infections (STIs) including gonorrhea and chlamydia if: ? You are sexually active and are younger than 33 years of age. ? You are older than 33 years of age and your health care provider tells you that you are at risk for this type of infection. ? Your sexual activity has changed since you were last screened and you are at an increased risk for chlamydia or gonorrhea. Ask your health care provider if you are at risk.  If you do not have HIV, but are at risk,  it may be recommended that you take a prescription medicine daily to prevent HIV infection. This is called pre-exposure prophylaxis (PrEP). You are considered at risk if: ? You are sexually active and do not regularly use condoms or know the HIV status of your partner(s). ? You take drugs by injection. ? You are sexually active with a partner who has HIV.  Talk with your health care provider about whether you   are at high risk of being infected with HIV. If you choose to begin PrEP, you should first be tested for HIV. You should then be tested every 3 months for as long as you are taking PrEP. Pregnancy  If you are premenopausal and you may become pregnant, ask your health care provider about preconception counseling.  If you may become pregnant, take 400 to 800 micrograms (mcg) of folic acid every day.  If you want to prevent pregnancy, talk to your health care provider about birth control (contraception). Osteoporosis and menopause  Osteoporosis is a disease in which the bones lose minerals and strength with aging. This can result in serious bone fractures. Your risk for osteoporosis can be identified using a bone density scan.  If you are 56 years of age or older, or if you are at risk for osteoporosis and fractures, ask your health care provider if you should be screened.  Ask your health care provider whether you should take a calcium or vitamin D supplement to lower your risk for osteoporosis.  Menopause may have certain physical symptoms and risks.  Hormone replacement therapy may reduce some of these symptoms and risks. Talk to your health care provider about whether hormone replacement therapy is right for you. Follow these instructions at home:  Schedule regular health, dental, and eye exams.  Stay current with your immunizations.  Do not use any tobacco products including cigarettes, chewing tobacco, or electronic cigarettes.  If you are pregnant, do not drink  alcohol.  If you are breastfeeding, limit how much and how often you drink alcohol.  Limit alcohol intake to no more than 1 drink per day for nonpregnant women. One drink equals 12 ounces of beer, 5 ounces of wine, or 1 ounces of hard liquor.  Do not use street drugs.  Do not share needles.  Ask your health care provider for help if you need support or information about quitting drugs.  Tell your health care provider if you often feel depressed.  Tell your health care provider if you have ever been abused or do not feel safe at home. This information is not intended to replace advice given to you by your health care provider. Make sure you discuss any questions you have with your health care provider. Document Released: 05/25/2011 Document Revised: 04/16/2016 Document Reviewed: 08/13/2015 Elsevier Interactive Patient Education  [employer].

## 2018-05-23 NOTE — Assessment & Plan Note (Signed)
Elevated today.  Patient stopped amlodipine due to rash.  Explained to her that in less than 1% population can have rash.  In this context is onset correlates with starting new medication, we will stop amlodipine and start lisinopril.  Patient will keep blood pressure log and send these to me.  Will follow.

## 2018-05-24 LAB — CYTOLOGY - PAP
DIAGNOSIS: NEGATIVE
HPV: NOT DETECTED

## 2018-05-26 ENCOUNTER — Encounter: Payer: Self-pay | Admitting: Family

## 2018-05-30 ENCOUNTER — Other Ambulatory Visit (INDEPENDENT_AMBULATORY_CARE_PROVIDER_SITE_OTHER): Payer: 59

## 2018-05-30 DIAGNOSIS — Z0000 Encounter for general adult medical examination without abnormal findings: Secondary | ICD-10-CM

## 2018-05-30 DIAGNOSIS — I10 Essential (primary) hypertension: Secondary | ICD-10-CM | POA: Diagnosis not present

## 2018-05-30 LAB — COMPREHENSIVE METABOLIC PANEL
ALT: 15 U/L (ref 0–35)
AST: 18 U/L (ref 0–37)
Albumin: 4.2 g/dL (ref 3.5–5.2)
Alkaline Phosphatase: 47 U/L (ref 39–117)
BILIRUBIN TOTAL: 0.4 mg/dL (ref 0.2–1.2)
BUN: 8 mg/dL (ref 6–23)
CO2: 28 meq/L (ref 19–32)
Calcium: 9.4 mg/dL (ref 8.4–10.5)
Chloride: 103 mEq/L (ref 96–112)
Creatinine, Ser: 0.73 mg/dL (ref 0.40–1.20)
GFR: 97.69 mL/min (ref >60.00)
Glucose, Bld: 96 mg/dL (ref 70–99)
POTASSIUM: 4.7 meq/L (ref 3.5–5.1)
SODIUM: 138 meq/L (ref 135–145)
Total Protein: 6.9 g/dL (ref 6.0–8.3)

## 2018-05-30 LAB — CBC WITH DIFFERENTIAL/PLATELET
BASOS ABS: 0.1 10*3/uL (ref 0.0–0.1)
BASOS PCT: 1.6 % (ref 0.0–3.0)
EOS ABS: 0.2 10*3/uL (ref 0.0–0.7)
Eosinophils Relative: 3 % (ref 0.0–5.0)
HCT: 34.5 % — ABNORMAL LOW (ref 36.0–46.0)
Hemoglobin: 11.4 g/dL — ABNORMAL LOW (ref 12.0–15.0)
LYMPHS PCT: 24.5 % (ref 12.0–46.0)
Lymphs Abs: 1.4 10*3/uL (ref 0.7–4.0)
MCHC: 32.9 g/dL (ref 30.0–36.0)
MCV: 78.4 fl (ref 78.0–100.0)
MONO ABS: 0.5 10*3/uL (ref 0.1–1.0)
Monocytes Relative: 9.5 % (ref 3.0–12.0)
NEUTROS ABS: 3.5 10*3/uL (ref 1.4–7.7)
NEUTROS PCT: 61.4 % (ref 43.0–77.0)
PLATELETS: 317 10*3/uL (ref 150.0–400.0)
RBC: 4.4 Mil/uL (ref 3.87–5.11)
RDW: 16.8 % — AB (ref 11.5–15.5)
WBC: 5.7 10*3/uL (ref 4.0–10.5)

## 2018-05-30 LAB — HEMOGLOBIN A1C: Hgb A1c MFr Bld: 5.5 % (ref 4.6–6.5)

## 2018-05-31 ENCOUNTER — Other Ambulatory Visit: Payer: Self-pay | Admitting: Family

## 2018-05-31 DIAGNOSIS — D649 Anemia, unspecified: Secondary | ICD-10-CM

## 2018-08-05 ENCOUNTER — Other Ambulatory Visit: Payer: Self-pay | Admitting: Family

## 2018-08-05 DIAGNOSIS — F902 Attention-deficit hyperactivity disorder, combined type: Secondary | ICD-10-CM

## 2018-08-08 NOTE — Telephone Encounter (Signed)
Last office visit 05/23/18 No office visit scheduled

## 2018-08-10 ENCOUNTER — Other Ambulatory Visit: Payer: Self-pay | Admitting: Family

## 2018-08-10 DIAGNOSIS — F902 Attention-deficit hyperactivity disorder, combined type: Secondary | ICD-10-CM

## 2018-08-10 MED ORDER — AMPHETAMINE-DEXTROAMPHET ER 20 MG PO CP24: 40.0000 mg | ORAL_CAPSULE | Freq: Every day | ORAL | 0 refills | Status: DC

## 2018-08-10 NOTE — Telephone Encounter (Signed)
Call pt  Please education patient. This is controlled substance;  In order for me to prescribe medication,  Patient must be seen every 3-4 months. please make follow-up appointment.  I have refilled adderall for 3 months supply however please make f/u  I looked up patient on [place] Controlled Substances Reporting System and saw no activity that raised concern of inappropriate use.

## 2018-08-11 NOTE — Telephone Encounter (Signed)
Last OV 05/23/2018   Last refilled 08/10/2018 disp 60 with no refills   Sent to PCP to advise

## 2018-08-12 NOTE — Telephone Encounter (Signed)
Spoke to pharmacy they have script ready to be picked up for patient . Left voicemail for patient advising script ready for pick up.

## 2018-08-12 NOTE — Telephone Encounter (Signed)
Call pt  I sent 3 scripts to her pharmacy  Im confused here

## 2018-08-14 ENCOUNTER — Encounter: Payer: Self-pay | Admitting: Family

## 2018-08-15 ENCOUNTER — Other Ambulatory Visit: Payer: Self-pay | Admitting: Family

## 2018-08-15 DIAGNOSIS — B9689 Other specified bacterial agents as the cause of diseases classified elsewhere: Secondary | ICD-10-CM

## 2018-08-15 DIAGNOSIS — N760 Acute vaginitis: Principal | ICD-10-CM

## 2018-08-15 MED ORDER — METRONIDAZOLE 500 MG PO TABS: 500.0000 mg | ORAL_TABLET | Freq: Two times a day (BID) | ORAL | 0 refills | Status: DC

## 2018-11-11 ENCOUNTER — Other Ambulatory Visit: Payer: Self-pay | Admitting: Family

## 2018-11-11 ENCOUNTER — Encounter: Payer: Self-pay | Admitting: Family

## 2018-11-11 DIAGNOSIS — B9689 Other specified bacterial agents as the cause of diseases classified elsewhere: Secondary | ICD-10-CM

## 2018-11-11 DIAGNOSIS — N760 Acute vaginitis: Principal | ICD-10-CM

## 2018-11-18 ENCOUNTER — Encounter: Payer: Self-pay | Admitting: Family

## 2018-11-18 ENCOUNTER — Other Ambulatory Visit: Payer: Self-pay | Admitting: Family

## 2018-11-18 DIAGNOSIS — N760 Acute vaginitis: Principal | ICD-10-CM

## 2018-11-18 DIAGNOSIS — B9689 Other specified bacterial agents as the cause of diseases classified elsewhere: Secondary | ICD-10-CM

## 2018-11-18 MED ORDER — METRONIDAZOLE 500 MG PO TABS: 500.0000 mg | ORAL_TABLET | Freq: Two times a day (BID) | ORAL | 0 refills | Status: DC

## 2018-11-24 ENCOUNTER — Encounter: Payer: Self-pay | Admitting: Family Medicine

## 2018-11-24 ENCOUNTER — Other Ambulatory Visit (HOSPITAL_COMMUNITY)
Admission: RE | Admit: 2018-11-24 | Discharge: 2018-11-24 | Disposition: A | Discharge status: Home / Self Care | Payer: 59 | Source: Ambulatory Visit | Attending: Family Medicine | Admitting: Family Medicine

## 2018-11-24 ENCOUNTER — Ambulatory Visit (INDEPENDENT_AMBULATORY_CARE_PROVIDER_SITE_OTHER): Payer: 59 | Admitting: Family Medicine

## 2018-11-24 VITALS — BP 122/76 | HR 96 | Temp 98.0°F | Ht 64.0 in | Wt 178.4 lb

## 2018-11-24 DIAGNOSIS — N760 Acute vaginitis: Secondary | ICD-10-CM

## 2018-11-24 DIAGNOSIS — N898 Other specified noninflammatory disorders of vagina: Secondary | ICD-10-CM | POA: Diagnosis not present

## 2018-11-24 DIAGNOSIS — B9689 Other specified bacterial agents as the cause of diseases classified elsewhere: Secondary | ICD-10-CM | POA: Diagnosis not present

## 2018-11-24 LAB — POCT URINALYSIS DIPSTICK
Bilirubin, UA: NEGATIVE
Glucose, UA: NEGATIVE
Ketones, UA: NEGATIVE
Leukocytes, UA: NEGATIVE
Nitrite, UA: NEGATIVE
PROTEIN UA: POSITIVE — AB
RBC UA: NEGATIVE
Spec Grav, UA: >=1.03 — AB (ref 1.010–1.025)
Urobilinogen, UA: 0.2 E.U./dL
pH, UA: 6 (ref 5.0–8.0)

## 2018-11-24 LAB — POCT URINE PREGNANCY: Preg Test, Ur: NEGATIVE

## 2018-11-24 MED ORDER — METRONIDAZOLE 0.75 % VA GEL: 1.0000 | Freq: Every day | VAGINAL | 0 refills | Status: AC

## 2018-11-24 NOTE — Progress Notes (Signed)
Subjective:    Patient ID: Megan Tucker, female    DOB: Jul 17, 1986, 33 y.o.   MRN: [id number]  HPI  Presents to clinic with suspected bacterial vaginosis.  Patient has had bacterial vaginosis before, and her current symptoms seem similar.  She does have some white vaginal discharge and some foul smell in vaginal area.  Denies any pain with urination.  Denies any possibility of STD, but is agreeable to STD testing.  Patient currently is on oral Flagyl tablets that were sent in by Rennie Plowman, FNP after patient sent a MyChart message describing her symptoms on 11/18/2018.  Patient Active Problem List   Diagnosis Date Noted  . HTN (hypertension) 05/09/2018  . Depression, recurrent (HCC) 03/07/2018  . Attention deficit hyperactivity disorder (ADHD) 07/22/2016  . Routine physical examination 05/21/2016   Social History   Tobacco Use  . Smoking status: Former Games developer  . Smokeless tobacco: Never Used  Substance Use Topics  . Alcohol use: Yes    Alcohol/week: 0.0 - 1.0 standard drinks    Comment: glass of wine socially; once per month    Review of Systems  Constitutional: Negative for chills, fatigue and fever.  HENT: Negative for congestion, ear pain, sinus pain and sore throat.   Eyes: Negative.   Respiratory: Negative for cough, shortness of breath and wheezing.   Cardiovascular: Negative for chest pain, palpitations and leg swelling.  Gastrointestinal: Negative for abdominal pain, diarrhea, nausea and vomiting.  Genitourinary: Negative for dysuria, frequency and urgency. +vaginal discharge, foul smell. Musculoskeletal: Negative for arthralgias and myalgias.  Skin: Negative for color change, pallor and rash.  Neurological: Negative for syncope, light-headedness and headaches.  Psychiatric/Behavioral: The patient is not nervous/anxious.       Objective:   Physical Exam Vitals signs and nursing note reviewed.  Constitutional:      General: She is not in acute distress.   Appearance: Normal appearance. She is not toxic-appearing.  HENT:     Head: Normocephalic and atraumatic.  Eyes:     General: No scleral icterus.    Extraocular Movements: Extraocular movements intact.     Conjunctiva/sclera: Conjunctivae normal.  Cardiovascular:     Rate and Rhythm: Normal rate and regular rhythm.  Pulmonary:     Effort: Pulmonary effort is normal.     Breath sounds: Normal breath sounds.  Abdominal:     General: Abdomen is flat. Bowel sounds are normal. There is no distension.     Palpations: Abdomen is soft.     Tenderness: There is no abdominal tenderness. There is no guarding or rebound.  Genitourinary:    General: Normal vulva.     Exam position: Supine.     Labia:        Right: No rash, tenderness, lesion or injury.        Left: No rash, tenderness, lesion or injury.      Vagina: Vaginal discharge (small amount of white vaginal discharge present. ) present.     Cervix: No cervical motion tenderness, discharge, lesion, erythema or cervical bleeding.     Uterus: Not tender.      Adnexa:        Right: No tenderness.         Left: No tenderness.    Skin:    General: Skin is warm and dry.     Coloration: Skin is not pale.  Neurological:     Mental Status: She is alert and oriented to person, place, and time.  Psychiatric:  Mood and Affect: Mood normal.        Behavior: Behavior normal.       Vitals:   11/24/18 0924  BP: 122/76  Pulse: 96  Temp: 98 F (36.7 C)  SpO2: 98%    Assessment & Plan:   Vaginal discharge/Bacterial vaginosis - due to exam and patient's symptoms I do suspect bacterial vaginosis is the culprit.  Vaginal swab sent out for STD testing as well as bacterial vaginosis and yeast infection testing.  Patient will finish oral Flagyl as prescribed and will also do MetroGel vaginal course at bedtime for 5 days.  Urinalysis overall unremarkable other than trace protein, we will send urine out for urine culture to rule out UTI.  Urine  pregnancy is negative.  Declines HIV testing today.  Patient will keep regularly scheduled follow-up as planned with PCP.  She will be contacted with results of her STD testing.  She is aware to contact us if her symptoms are not improving.

## 2018-11-25 LAB — CERVICOVAGINAL ANCILLARY ONLY
Bacterial vaginitis: NEGATIVE
Candida vaginitis: NEGATIVE
Chlamydia: NEGATIVE
Neisseria Gonorrhea: NEGATIVE
Trichomonas: NEGATIVE

## 2018-11-25 LAB — URINE CULTURE
MICRO NUMBER: 2929
SPECIMEN QUALITY:: ADEQUATE

## 2018-11-29 LAB — CERVICOVAGINAL PAP THINPREP IMAGED: Herpes: NEGATIVE

## 2018-12-14 ENCOUNTER — Encounter: Payer: Self-pay | Admitting: Family

## 2018-12-14 ENCOUNTER — Ambulatory Visit (INDEPENDENT_AMBULATORY_CARE_PROVIDER_SITE_OTHER): Payer: 59 | Admitting: Family

## 2018-12-14 VITALS — BP 122/84 | HR 101 | Temp 98.0°F | Wt 177.8 lb

## 2018-12-14 DIAGNOSIS — N920 Excessive and frequent menstruation with regular cycle: Secondary | ICD-10-CM

## 2018-12-14 DIAGNOSIS — F902 Attention-deficit hyperactivity disorder, combined type: Secondary | ICD-10-CM

## 2018-12-14 DIAGNOSIS — E669 Obesity, unspecified: Secondary | ICD-10-CM

## 2018-12-14 DIAGNOSIS — I10 Essential (primary) hypertension: Secondary | ICD-10-CM | POA: Diagnosis not present

## 2018-12-14 DIAGNOSIS — N921 Excessive and frequent menstruation with irregular cycle: Secondary | ICD-10-CM | POA: Insufficient documentation

## 2018-12-14 LAB — CBC WITH DIFFERENTIAL/PLATELET
Basophils Absolute: 0.1 10*3/uL (ref 0.0–0.1)
Basophils Relative: 1.3 % (ref 0.0–3.0)
Eosinophils Absolute: 0.2 10*3/uL (ref 0.0–0.7)
Eosinophils Relative: 2.8 % (ref 0.0–5.0)
HCT: 35.6 % — ABNORMAL LOW (ref 36.0–46.0)
Hemoglobin: 11.5 g/dL — ABNORMAL LOW (ref 12.0–15.0)
Lymphocytes Relative: 22.3 % (ref 12.0–46.0)
Lymphs Abs: 1.8 10*3/uL (ref 0.7–4.0)
MCHC: 32.3 g/dL (ref 30.0–36.0)
MCV: 77.3 fl — ABNORMAL LOW (ref 78.0–100.0)
MONOS PCT: 9.3 % (ref 3.0–12.0)
Monocytes Absolute: 0.8 10*3/uL (ref 0.1–1.0)
Neutro Abs: 5.2 10*3/uL (ref 1.4–7.7)
Neutrophils Relative %: 64.3 % (ref 43.0–77.0)
Platelets: 359 10*3/uL (ref 150.0–400.0)
RBC: 4.61 Mil/uL (ref 3.87–5.11)
RDW: 17 % — ABNORMAL HIGH (ref 11.5–15.5)
WBC: 8.1 10*3/uL (ref 4.0–10.5)

## 2018-12-14 LAB — IBC PANEL
Iron: 26 ug/dL — ABNORMAL LOW (ref 42–145)
SATURATION RATIOS: 6.4 % — AB (ref 20.0–50.0)
Transferrin: 290 mg/dL (ref 212.0–360.0)

## 2018-12-14 LAB — FERRITIN: Ferritin: 4.9 ng/mL — ABNORMAL LOW (ref 10.0–291.0)

## 2018-12-14 NOTE — Assessment & Plan Note (Signed)
Recently controlled without medication at this time.  Advised patient continue to monitor.

## 2018-12-14 NOTE — Patient Instructions (Addendum)
Please return stool cards.   Today we discussed referrals, orders. OB GYN    I have placed these orders in the system for you.  Please be sure to give [place] a call if you have not heard from our office regarding this. We should hear from [place] within ONE week with information regarding your appointment. If not, please let me know immediately.   Monitor blood pressure,  Goal is less than 120/80, based on newest guidelines; if persistently higher, please make sooner follow up appointment so we can recheck you blood pressure and manage medications   Please start walking program.   This is  Dr. Melina Schools  example of a  "Low GI"  Diet:  It will allow you to lose 4 to 8  lbs  per month if you follow it carefully.  Your goal with exercise is a minimum of 30 minutes of aerobic exercise 5 days per week (Walking does not count once it becomes easy!)    All of the foods can be found at grocery stores and in bulk at [employer].  The Atkins protein bars and shakes are available in more varieties at Target, WalMart and Lowe's Foods.     7 AM Breakfast:  Choose from the following:  Low carbohydrate Protein  Shakes (I recommend the  Premier Protein chocolate shakes,  EAS AdvantEdge "Carb Control" shakes  Or the Atkins shakes all are under 3 net carbs)     a scrambled egg/bacon/cheese burrito made with Mission's "carb balance" whole wheat tortilla  (about 10 net carbs )  Medical laboratory scientific officer (basically a quiche without the pastry crust) that is eaten cold and very convenient way to get your eggs.  8 carbs)  If you make your own protein shakes, avoid bananas and pineapple,  And use low carb greek yogurt or original /unsweetened almond or soy milk    Avoid cereal and bananas, oatmeal and cream of wheat and grits. They are loaded with carbohydrates!   10 AM: high protein snack:  Protein bar by Atkins (the snack size, under 200 cal, usually < 6 net carbs).    A stick of cheese:  Around 1 carb,  100  cal     Dannon Light n Fit [place] Yogurt  (80 cal, 8 carbs)  Other so called "protein bars" and Greek yogurts tend to be loaded with carbohydrates.  Remember, in food advertising, the word "energy" is synonymous for " carbohydrate."  Lunch:   A Sandwich using the bread choices listed, Can use any  Eggs,  lunchmeat, grilled meat or canned tuna), avocado, regular mayo/mustard  and cheese.  A Salad using blue cheese, ranch,  Goddess or vinagrette,  Avoid taco shells, croutons or "confetti" and no "candied nuts" but regular nuts OK.   No pretzels, nabs  or chips.  Pickles and miniature sweet peppers are a good low carb alternative that provide a "crunch"  The bread is the only source of carbohydrate in a sandwich and  can be decreased by trying some of the attached alternatives to traditional loaf bread   Avoid "Low fat dressings, as well as Reyne Dumas and [employer] Foods dressings They are loaded with sugar!   3 PM/ Mid day  Snack:  Consider  1 ounce of  almonds, walnuts, pistachios, pecans, peanuts,  Macadamia nuts or a nut medley.  Avoid "granola and granola bars "  Mixed nuts are ok in moderation as long as there are no raisins,  cranberries  or dried fruit.   KIND bars are OK if you get the low glycemic index variety   Try the prosciutto/mozzarella cheese sticks by Fiorruci  In deli /backery section   High protein      6 PM  Dinner:     Meat/fowl/fish with a green salad, and either broccoli, cauliflower, green beans, spinach, brussel sprouts or  Lima beans. DO NOT BREAD THE PROTEIN!!      There is a low carb pasta by Dreamfield's that is acceptable and tastes great: only 5 digestible carbs/serving.( All grocery stores but BJs carry it ) Several ready made meals are available low carb:   Try Michel Angelo's chicken piccata or chicken or eggplant parm over low carb pasta.(Lowes and BJs)   Clifton Custard Sanchez's "Carnitas" (pulled pork, no sauce,  0 carbs) or his beef pot roast to make a dinner burrito  (at BJ's)  Pesto over low carb pasta (bj's sells a good quality pesto in the center refrigerated section of the deli   Try satueeing  Roosvelt Harps with mushroooms as a good side   Green Giant makes a mashed cauliflower that tastes like mashed potatoes  Whole wheat pasta is still full of digestible carbs and  Not as low in glycemic index as Dreamfield's.   Brown rice is still rice,  So skip the rice and noodles if you eat [place] or [place] (or at least limit to 1/2 cup)  9 PM snack :   Breyer's "low carb" fudgsicle or  ice cream bar (Carb Smart line), or  Weight Watcher's ice cream bar , or another "no sugar added" ice cream;  a serving of fresh berries/cherries with whipped cream   Cheese or DANNON'S LlGHT N FIT GREEK YOGURT  8 ounces of [place] unsweetened almond/cococunut milk    Treat yourself to a parfait made with whipped cream blueberiies, walnuts and vanilla greek yogurt  Avoid bananas, pineapple, grapes  and watermelon on a regular basis because they are high in sugar.  THINK OF THEM AS DESSERT  Remember that snack Substitutions should be less than 10 NET carbs per serving and meals < 20 carbs. Remember to subtract fiber grams to get the "net carbs."  @TULLOBREADPACKAGE @

## 2018-12-14 NOTE — Progress Notes (Signed)
Subjective:    Patient ID: Megan Tucker, female    DOB: 04/06/1986, 33 y.o.   MRN: [id number]  CC: Megan Tucker is a 33 y.o. female who presents today for follow up.   HPI: Accompanied by son today.  HTN no longer on lisinopril. Monitors at home, 120/80. No CP, sob.   Depression- no longer on zoloft. Feels well. 'in a good place.'  ADHD- doing well on adderall  40mg . Will go back and forth , and only take it on work day. NO increased anxiety.  Had weight los on adderall due to reduced appetite. Eating very unhealthy, chocolate and soda. No exercise.   Heavy periods.  Wearing super max tampons and also a pad.  Menses 6 days Doesn't follow with GYN. No large quarter sized clots. Occasional dizziness.   Anemic for years. Not on iron. Regular bowel movements.       HISTORY:  Past Medical History:  Diagnosis Date  . Anemia   . Anemia   . Gestational diabetes    Past Surgical History:  Procedure Laterality Date  . MOUTH SURGERY    . TUBAL LIGATION  2014   Family History  Problem Relation Age of Onset  . Drug abuse Mother        narcotic addition  . Stroke Mother   . Hypertension Mother   . Mental illness Mother   . Diabetes Mother   . Diabetes Father   . Hypertension Maternal Grandmother   . Diabetes Maternal Grandmother   . Diabetes Maternal Grandfather   . Diabetes Paternal Grandmother   . Diabetes Paternal Grandfather   . Other Neg Hx     Allergies: Adhesive [tape] Current Outpatient Medications on File Prior to Visit  Medication Sig Dispense Refill  . amphetamine-dextroamphetamine (ADDERALL XR) 20 MG 24 hr capsule Take 2 capsules (40 mg total) by mouth daily. 60 capsule 0   No current facility-administered medications on file prior to visit.     Social History   Tobacco Use  . Smoking status: Former Games developermoker  . Smokeless tobacco: Never Used  Substance Use Topics  . Alcohol use: Yes    Alcohol/week: 0.0 - 1.0 standard drinks    Comment: glass of wine  socially; once per month  . Drug use: No    Review of Systems  Constitutional: Negative for chills and fever.  Respiratory: Negative for cough.   Cardiovascular: Negative for chest pain and palpitations.  Gastrointestinal: Negative for nausea and vomiting.  Genitourinary: Positive for vaginal bleeding. Negative for vaginal discharge and vaginal pain.  Neurological: Positive for dizziness (occasional).  Psychiatric/Behavioral: Negative for sleep disturbance and suicidal ideas. The patient is not nervous/anxious.       Objective:    BP 122/84 (BP Location: Left Arm, Patient Position: Sitting, Cuff Size: Large)   Pulse (!) 101   Temp 98 F (36.7 C)   Wt 177 lb 12.8 oz (80.6 kg)   SpO2 98%   BMI 30.52 kg/m  BP Readings from Last 3 Encounters:  12/14/18 122/84  11/24/18 122/76  05/23/18 (!) 154/100   Wt Readings from Last 3 Encounters:  12/14/18 177 lb 12.8 oz (80.6 kg)  11/24/18 178 lb 6.4 oz (80.9 kg)  05/23/18 166 lb 4 oz (75.4 kg)    Physical Exam Vitals signs reviewed.  Constitutional:      Appearance: She is well-developed.  Eyes:     Conjunctiva/sclera: Conjunctivae normal.  Cardiovascular:     Rate and Rhythm:  Normal rate and regular rhythm.     Pulses: Normal pulses.     Heart sounds: Normal heart sounds.  Pulmonary:     Effort: Pulmonary effort is normal.     Breath sounds: Normal breath sounds. No wheezing, rhonchi or rales.  Skin:    General: Skin is warm and dry.  Neurological:     Mental Status: She is alert.  Psychiatric:        Speech: Speech normal.        Behavior: Behavior normal.        Thought Content: Thought content normal.        Assessment & Plan:   Problem List Items Addressed This Visit      Cardiovascular and Mediastinum   HTN (hypertension)    Recently controlled without medication at this time.  Advised patient continue to monitor.        Other   Attention deficit hyperactivity disorder (ADHD)    Doing well on regimen,  will continue      Menorrhagia with regular cycle - Primary    In setting of anemia, I am concerned with menorrhagia.  We jointly consult with GYN is appropriate.  In the interim, pending further lab evaluations, stool cards.      Relevant Orders   CBC with Differential/Platelet   Ambulatory referral to Obstetrics / Gynecology   Ferritin   IBC panel   Obesity (BMI 30-39.9)    Discussed lifestyle changes including eliminating soda, low-carb lifestyle and exercise.  Close follow-up.          I have discontinued Megan Nine "Schae Leopard"'s sertraline, lisinopril, and metroNIDAZOLE. I am also having her maintain her amphetamine-dextroamphetamine.   No orders of the defined types were placed in this encounter.   Return precautions given.   Risks, benefits, and alternatives of the medications and treatment plan prescribed today were discussed, and patient expressed understanding.   Education regarding symptom management and diagnosis given to patient on AVS.  Continue to follow with Allegra Grana, FNP for routine health maintenance.   Megan Nine and I agreed with plan.   Rennie Plowman, FNP

## 2018-12-14 NOTE — Assessment & Plan Note (Signed)
Discussed lifestyle changes including eliminating soda, low-carb lifestyle and exercise.  Close follow-up.

## 2018-12-14 NOTE — Assessment & Plan Note (Signed)
Doing well on regimen, will continue 

## 2018-12-14 NOTE — Assessment & Plan Note (Signed)
In setting of anemia, I am concerned with menorrhagia.  We jointly consult with GYN is appropriate.  In the interim, pending further lab evaluations, stool cards.

## 2018-12-21 ENCOUNTER — Encounter: Payer: Self-pay | Admitting: Family

## 2018-12-27 ENCOUNTER — Other Ambulatory Visit: Payer: Self-pay | Admitting: Family

## 2018-12-27 ENCOUNTER — Ambulatory Visit (INDEPENDENT_AMBULATORY_CARE_PROVIDER_SITE_OTHER): Payer: 59 | Admitting: Certified Nurse Midwife

## 2018-12-27 ENCOUNTER — Encounter: Payer: Self-pay | Admitting: Certified Nurse Midwife

## 2018-12-27 ENCOUNTER — Encounter: Payer: Self-pay | Admitting: Family

## 2018-12-27 VITALS — BP 158/104 | HR 90 | Ht 64.0 in | Wt 177.1 lb

## 2018-12-27 DIAGNOSIS — N939 Abnormal uterine and vaginal bleeding, unspecified: Secondary | ICD-10-CM | POA: Diagnosis not present

## 2018-12-27 DIAGNOSIS — F902 Attention-deficit hyperactivity disorder, combined type: Secondary | ICD-10-CM

## 2018-12-27 NOTE — Patient Instructions (Signed)
Abnormal Uterine Bleeding  Abnormal uterine bleeding means bleeding more than usual from your uterus. It can include:   Bleeding between periods.   Bleeding after sex.   Bleeding that is heavier than normal.   Periods that last longer than usual.   Bleeding after you have stopped having your period (menopause).  There are many problems that may cause this. You should see a doctor for any kind of bleeding that is not normal. Treatment depends on the cause of the bleeding.  Follow these instructions at home:   Watch your condition for any changes.   Do not use tampons, douche, or have sex, if your doctor tells you not to.   Change your pads often.   Get regular well-woman exams. Make sure they include a pelvic exam and cervical cancer screening.   Keep all follow-up visits as told by your doctor. This is important.  Contact a doctor if:   The bleeding lasts more than one week.   You feel dizzy at times.   You feel like you are going to throw up (nauseous).   You throw up.  Get help right away if:   You pass out.   You have to change pads every hour.   You have belly (abdominal) pain.   You have a fever.   You get sweaty.   You get weak.   You passing large blood clots from your vagina.  Summary   Abnormal uterine bleeding means bleeding more than usual from your uterus.   There are many problems that may cause this. You should see a doctor for any kind of bleeding that is not normal.   Treatment depends on the cause of the bleeding.  This information is not intended to replace advice given to you by your health care provider. Make sure you discuss any questions you have with your health care provider.  Document Released: 09/06/2009 Document Revised: 11/03/2016 Document Reviewed: 11/03/2016  Elsevier Interactive Patient Education  2019 Elsevier Inc.

## 2018-12-27 NOTE — Progress Notes (Signed)
GYN ENCOUNTER NOTE  Subjective:       Megan Tucker is a 33 y.o. G55P3003 female is here for gynecologic evaluation of the following issues:  1. menorrhaga.  Pt state over the past year ( June 2019) her periods have gotten worse. They last for 6-7 days with the first 3 days being the heaviest. She wears a tampon super plus and uses a pad that she changes every hour. She passes dime to nickel size clots. The bleeding is accompanied by sever cramps that she uses motrin and tylenol for. The medications do help the cramping some. She state that through out the month she will have episodes of mild cramping regardless of being on her period. She has also experienced dyspareunia  this year.     Gynecologic History Patient's last menstrual period was 11/30/2018 (exact date). Contraception: none and had tubal ligation  Last Pap: 7/ 2019. Results were: normal , negative HPV Last mammogram: N/A .  Obstetric History OB History  Gravida Para Term Preterm AB Living  3 3 3  0 0 3  SAB TAB Ectopic Multiple Live Births  0 0 0 0 3    # Outcome Date GA Lbr Len/2nd Weight Sex Delivery Anes PTL Lv  3 Term 12/25/12 [redacted]w[redacted]d 09:08 / 00:27 8 lb 9 oz (3.885 kg) M Vag-Spont EPI  LIV  2 Term 10/28/06   8 lb 2 oz (3.685 kg) M Vag-Spont   LIV  1 Term 06/07/02   7 lb 2 oz (3.232 kg) M Vag-Spont   LIV    Past Medical History:  Diagnosis Date  . Anemia   . Anemia   . Gestational diabetes     Past Surgical History:  Procedure Laterality Date  . MOUTH SURGERY    . TUBAL LIGATION  2014    Current Outpatient Medications on File Prior to Visit  Medication Sig Dispense Refill  . amphetamine-dextroamphetamine (ADDERALL XR) 20 MG 24 hr capsule Take 2 capsules (40 mg total) by mouth daily. 60 capsule 0   No current facility-administered medications on file prior to visit.     Allergies  Allergen Reactions  . Adhesive [Tape] Rash    Social History   Socioeconomic History  . Marital status: Single    Spouse  name: Not on file  . Number of children: Not on file  . Years of education: Not on file  . Highest education level: Not on file  Occupational History  . Not on file  Social Needs  . Financial resource strain: Not on file  . Food insecurity:    Worry: Not on file    Inability: Not on file  . Transportation needs:    Medical: Not on file    Non-medical: Not on file  Tobacco Use  . Smoking status: Former Games developer  . Smokeless tobacco: Never Used  Substance and Sexual Activity  . Alcohol use: Yes    Alcohol/week: 0.0 - 1.0 standard drinks    Comment: glass of wine socially; once per month  . Drug use: No  . Sexual activity: Not Currently    Birth control/protection: Surgical  Lifestyle  . Physical activity:    Days per week: Not on file    Minutes per session: Not on file  . Stress: Not on file  Relationships  . Social connections:    Talks on phone: Not on file    Gets together: Not on file    Attends religious service: Not on file  Active member of club or organization: Not on file    Attends meetings of clubs or organizations: Not on file    Relationship status: Not on file  . Intimate partner violence:    Fear of current or ex partner: Not on file    Emotionally abused: Not on file    Physically abused: Not on file    Forced sexual activity: Not on file  Other Topics Concern  . Not on file  Social History Narrative   3 boys      Single mom- however father of children are very close, involved.       Nurse Tech ICU             Family History  Problem Relation Age of Onset  . Drug abuse Mother        narcotic addition  . Stroke Mother   . Hypertension Mother   . Mental illness Mother   . Diabetes Mother   . Diabetes Father   . Hypertension Maternal Grandmother   . Diabetes Maternal Grandmother   . Diabetes Maternal Grandfather   . Diabetes Paternal Grandmother   . Diabetes Paternal Grandfather   . Other Neg Hx     The following portions of the  patient's history were reviewed and updated as appropriate: allergies, current medications, past family history, past medical history, past social history, past surgical history and problem list.  Review of Systems Review of Systems - Negative except as mentioned in HPI Review of Systems - General ROS: negative for - chills, fatigue, fever, hot flashes, malaise or night sweats Hematological and Lymphatic ROS: negative for - bleeding problems or swollen lymph nodes Gastrointestinal ROS: negative for - abdominal pain, blood in stools, change in bowel habits and nausea/vomiting Musculoskeletal ROS: negative for - joint pain, muscle pain or muscular weakness Genito-Urinary ROS: negative for - change in menstrual cycle, dysmenorrhea, dyspareunia, dysuria, genital discharge, genital ulcers, hematuria, incontinence, irregular menses, nocturia or pelvic pain. Positive- heavy menses,  Objective:   BP (!) 158/104   Pulse 90   Ht 5\' 4"  (1.626 m)   Wt 177 lb 2 oz (80.3 kg)   LMP 11/30/2018 (Exact Date)   BMI 30.40 kg/m  CONSTITUTIONAL: Well-developed, well-nourished female in no acute distress.  HENT:  Normocephalic, atraumatic.  NECK: Normal range of motion, supple, no masses.  Normal thyroid.  SKIN: Skin is warm and dry. No rash noted. Not diaphoretic. No erythema. No pallor. NEUROLGIC: Alert and oriented to person, place, and time.Marland Kitchen.  PSYCHIATRIC: Normal mood and affect. Normal behavior. Normal judgment and thought content. CARDIOVASCULAR:Not Examined RESPIRATORY: Not Examined BREASTS: Not Examined ABDOMEN: Soft, non distended; Non tender.  No Organomegaly. PELVIC:  External Genitalia: Normal  BUS: Normal  Vagina: Normal, pt complains of slight discomfort at 12 oclock on placement of speculum and with bimanual exam. No masses felt  Cervix: Normal  Uterus: Normal size, shape,consistency, mobile  Adnexa: Normal  RV: Normal   Bladder: Nontender MUSCULOSKELETAL: Normal range of motion. No  tenderness.  No cyanosis, clubbing, or edema.   Assessment:   Menorrhagia    Plan:   Pt had CBC 13 days ago in PCP. She denies history of clotting disorders. U/S pelvic ordered. Reviewed possible causes I.e. fibroids, endometriosis. Discussed treatment option of birth control to help control bleeding, ablation, hysterectomy, and  tranexamic acid. Pamphlets given with treatment options. Will follow up with u/s results.   Doreene BurkeAnnie Dasean Brow, CNM

## 2018-12-28 ENCOUNTER — Ambulatory Visit (INDEPENDENT_AMBULATORY_CARE_PROVIDER_SITE_OTHER): Payer: 59

## 2018-12-28 DIAGNOSIS — N939 Abnormal uterine and vaginal bleeding, unspecified: Secondary | ICD-10-CM | POA: Diagnosis not present

## 2018-12-28 MED ORDER — AMPHETAMINE-DEXTROAMPHET ER 20 MG PO CP24: 40.0000 mg | ORAL_CAPSULE | Freq: Every day | ORAL | 0 refills | Status: DC

## 2018-12-28 NOTE — Telephone Encounter (Signed)
I looked up patient on [place] Controlled Substances Reporting System and saw no activity that raised concern of inappropriate use.   

## 2018-12-29 LAB — CERVICOVAGINAL ANCILLARY ONLY: Herpes: NEGATIVE

## 2018-12-30 ENCOUNTER — Other Ambulatory Visit: Payer: Self-pay

## 2018-12-30 MED ORDER — LISINOPRIL 5 MG PO TABS: 5.0000 mg | ORAL_TABLET | Freq: Every day | ORAL | 3 refills | Status: DC

## 2019-01-11 ENCOUNTER — Ambulatory Visit (INDEPENDENT_AMBULATORY_CARE_PROVIDER_SITE_OTHER): Payer: 59 | Admitting: Family

## 2019-01-11 ENCOUNTER — Encounter: Payer: Self-pay | Admitting: Family

## 2019-01-11 ENCOUNTER — Ambulatory Visit: Payer: Self-pay | Admitting: Family

## 2019-01-11 VITALS — BP 150/98 | HR 101 | Temp 97.6°F | Wt 182.2 lb

## 2019-01-11 DIAGNOSIS — N760 Acute vaginitis: Secondary | ICD-10-CM | POA: Diagnosis not present

## 2019-01-11 DIAGNOSIS — M545 Low back pain, unspecified: Secondary | ICD-10-CM

## 2019-01-11 DIAGNOSIS — N920 Excessive and frequent menstruation with regular cycle: Secondary | ICD-10-CM

## 2019-01-11 DIAGNOSIS — B9689 Other specified bacterial agents as the cause of diseases classified elsewhere: Secondary | ICD-10-CM

## 2019-01-11 DIAGNOSIS — I10 Essential (primary) hypertension: Secondary | ICD-10-CM

## 2019-01-11 MED ORDER — HYDROCHLOROTHIAZIDE 12.5 MG PO CAPS: 12.5000 mg | ORAL_CAPSULE | Freq: Every day | ORAL | 1 refills | Status: DC

## 2019-01-11 MED ORDER — CYCLOBENZAPRINE HCL 5 MG PO TABS: 5.0000 mg | ORAL_TABLET | Freq: Three times a day (TID) | ORAL | 1 refills | Status: DC | PRN

## 2019-01-11 MED ORDER — DICLOFENAC SODIUM 1 % TD GEL: 4.0000 g | Freq: Four times a day (QID) | TRANSDERMAL | 3 refills | Status: DC

## 2019-01-11 MED ORDER — LIDOCAINE 5 % EX PTCH: 1.0000 | MEDICATED_PATCH | CUTANEOUS | 0 refills | Status: DC

## 2019-01-11 NOTE — Assessment & Plan Note (Signed)
Established with with [facility]; will follow

## 2019-01-11 NOTE — Assessment & Plan Note (Signed)
Defer testing for BV as patient is currently on metrogel.  Advised that she complete 5 days of this, start probiotics.  Ifb ecomes recurrent, advised to follow-up with GYN for possible boric acid

## 2019-01-11 NOTE — Patient Instructions (Addendum)
Complete 5 days of metrogel and also start probiotics.  We will see if bacterial vaginosis recurs  Stop lisinopril  Start hctz  Monitor blood pressure,  Goal is less than 120/80, based on newest guidelines; if persistently higher, please make sooner follow up appointment so we can recheck you blood pressure and manage medications  Use topical therapies for low back pain as we do not want to raise your blood pressure.  Please refrain from using any anti-inflammatories including naproxen, ibuprofen as this can raise your blood pressure

## 2019-01-11 NOTE — Assessment & Plan Note (Signed)
Improved today.  Advised topical therapies, and avoid NSAIDs as may further raise her blood pressure.  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  Patient will let me know if no improvement with conservative management.

## 2019-01-11 NOTE — Assessment & Plan Note (Signed)
Elevated. Start hctz. Close f/u one week.

## 2019-01-11 NOTE — Progress Notes (Signed)
Subjective:    Patient ID: Megan Tucker, female    DOB: 1986-07-15, 33 y.o.   MRN: [id number]  CC: Megan Tucker is a 33 y.o. female who presents today for follow up.   HPI: HTN- hasnt  taken lisinopril today. 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 Denies exertional chest pain or pressure, numbness or tingling radiating to left arm or jaw, palpitations, dizziness, frequent headaches, changes in vision, or shortness of breath.    Menorrhagia - has seen [facility]. Awaiting results of pelvic US.  Low right side back pain x 5 days, waxing and waning. Better today.  Describes as sore. 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  Pain radiates to right anterior thigh, yesterday felt pain in both anterior thighs. Worse after sitting.  Onset after menses. Improvement with naprosyn 550mg , flexeril.  Heat with some relief. No numbness, trouble urinating. No h/o cancer.   Think she may have bacterial vaginosis again. Foul odor.  Has been using metrogel with improvement.       HISTORY:  Past Medical History:  Diagnosis Date  . Anemia   . Anemia   . Gestational diabetes    Past Surgical History:  Procedure Laterality Date  . MOUTH SURGERY    . TUBAL LIGATION  2014   Family History  Problem Relation Age of Onset  . Drug abuse Mother        narcotic addition  . Stroke Mother   . Hypertension Mother   . Mental illness Mother   . Diabetes Mother   . Diabetes Father   . Hypertension Maternal Grandmother   . Diabetes Maternal Grandmother   . Diabetes Maternal Grandfather   . Diabetes Paternal Grandmother   . Diabetes Paternal Grandfather   . Other Neg Hx     Allergies: Amlodipine and Adhesive [tape] Current Outpatient Medications on File Prior to Visit  Medication Sig Dispense Refill  . amphetamine-dextroamphetamine (ADDERALL XR) 20 MG 24 hr capsule Take 2 capsules (40 mg total) by mouth  daily. 60 capsule 0   No current facility-administered medications on file prior to visit.     Social History   Tobacco Use  . Smoking status: Former Games developer  . Smokeless tobacco: Never Used  Substance Use Topics  . Alcohol use: Yes    Alcohol/week: 0.0 - 1.0 standard drinks    Comment: glass of wine socially; once per month  . Drug use: No    Review of Systems  Constitutional: Negative for chills and fever.  Respiratory: Negative for cough.   Cardiovascular: Negative for chest pain and palpitations.  Gastrointestinal: Negative for nausea and vomiting.  Genitourinary: Positive for vaginal discharge (imrpoving).  Musculoskeletal: Positive for back pain.  Neurological: Negative for numbness.      Objective:    BP (!) 150/98 (BP Location: Left Arm, Patient Position: Sitting, Cuff Size: Large)   Pulse (!) 101   Temp 97.6 F (36.4 C)   Wt 182 lb 3.2 oz (82.6 kg)   SpO2 98%   BMI 31.27 kg/m  BP Readings from Last 3 Encounters:  01/11/19 (!) 150/98  12/27/18 (!) 158/104  12/14/18 122/84   Wt Readings from Last 3 Encounters:  01/11/19 182 lb 3.2 oz (82.6 kg)  12/27/18 177 lb 2 oz (80.3 kg)  12/14/18 177 lb 12.8 oz (80.6 kg)    Physical Exam Vitals signs reviewed.  Constitutional:  Appearance: She is well-developed.  Eyes:     Conjunctiva/sclera: Conjunctivae normal.  Cardiovascular:     Rate and Rhythm: Normal rate and regular rhythm.     Pulses: Normal pulses.     Heart sounds: Normal heart sounds.  Pulmonary:     Effort: Pulmonary effort is normal.     Breath sounds: Normal breath sounds. No wheezing, rhonchi or rales.  Musculoskeletal:     Lumbar back: She exhibits tenderness and pain. She exhibits normal range of motion, no bony tenderness, no swelling and no edema.       Back:     Comments: Pain over right SI joint. No pain with deep palpation of greater trochanter.    No limp or waddling gait.     Skin:    General: Skin is warm and dry.    Neurological:     Mental Status: She is alert.  Psychiatric:        Speech: Speech normal.        Behavior: Behavior normal.        Thought Content: Thought content normal.        Assessment & Plan:   Problem List Items Addressed This Visit      Cardiovascular and Mediastinum   HTN (hypertension) - Primary    Elevated. Start hctz. Close f/u one week.       Relevant Medications   hydrochlorothiazide (MICROZIDE) 12.5 MG capsule     Genitourinary   Bacterial vaginosis    Defer testing for BV as patient is currently on metrogel.  Advised that she complete 5 days of this, start probiotics.  Ifb ecomes recurrent, advised to follow-up with GYN for possible boric acid        Other   Menorrhagia with regular cycle    Established with with [facility]; will follow      Acute right-sided low back pain without sciatica    Improved today.  Advised topical therapies, and avoid NSAIDs as may further raise her blood pressure.  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  Patient will let me know if no improvement with conservative management.      Relevant Medications   lidocaine (LIDODERM) 5 %   diclofenac sodium (VOLTAREN) 1 % GEL   cyclobenzaprine (FLEXERIL) 5 MG tablet       I have discontinued Tyrone Nine "Harmoney Coleman"'s lisinopril. I am also having her start on hydrochlorothiazide, lidocaine, diclofenac sodium, and cyclobenzaprine. Additionally, I am having her maintain her amphetamine-dextroamphetamine.   Meds ordered this encounter  Medications  . hydrochlorothiazide (MICROZIDE) 12.5 MG capsule    Sig: Take 1 capsule (12.5 mg total) by mouth daily.    Dispense:  90 capsule    Refill:  1    Order Specific Question:   Supervising Provider    Answer:   Darrick Huntsman, TERESA L [[id number]]  . lidocaine (LIDODERM) 5 %    Sig: Place 1 patch onto the skin daily. Remove & Discard patch within 12 hours.    Dispense:  30 patch    Refill:  0    Order Specific Question:    Supervising Provider    Answer:   Duncan Dull L [[id number]]  . diclofenac sodium (VOLTAREN) 1 % GEL    Sig: Apply 4 g topically 4 (four) times daily.    Dispense:  1 Tube    Refill:  3    Order Specific Question:   Supervising Provider    Answer:   Darrick Huntsman,  TERESA L [[id number]]  . cyclobenzaprine (FLEXERIL) 5 MG tablet    Sig: Take 1 tablet (5 mg total) by mouth 3 (three) times daily as needed for muscle spasms.    Dispense:  30 tablet    Refill:  1    Order Specific Question:   Supervising Provider    Answer:   Sherlene ShamsULLO, TERESA L [[id number]]    Return precautions given.   Risks, benefits, and alternatives of the medications and treatment plan prescribed today were discussed, and patient expressed understanding.   Education regarding symptom management and diagnosis given to patient on AVS.  Continue to follow with Allegra Grana,  G, FNP for routine health maintenance.   Tyrone NineKara M Roedel and I agreed with plan.   Rennie Plowman , FNP

## 2019-01-18 ENCOUNTER — Encounter: Payer: Self-pay | Admitting: Family

## 2019-01-18 ENCOUNTER — Ambulatory Visit: Payer: 59 | Admitting: Family

## 2019-01-18 VITALS — BP 118/82 | HR 109 | Temp 98.5°F | Wt 179.2 lb

## 2019-01-18 DIAGNOSIS — I10 Essential (primary) hypertension: Secondary | ICD-10-CM | POA: Diagnosis not present

## 2019-01-18 DIAGNOSIS — R000 Tachycardia, unspecified: Secondary | ICD-10-CM | POA: Insufficient documentation

## 2019-01-18 DIAGNOSIS — N920 Excessive and frequent menstruation with regular cycle: Secondary | ICD-10-CM

## 2019-01-18 LAB — COMPREHENSIVE METABOLIC PANEL
ALT: 13 U/L (ref 0–35)
AST: 13 U/L (ref 0–37)
Albumin: 4.5 g/dL (ref 3.5–5.2)
Alkaline Phosphatase: 61 U/L (ref 39–117)
BILIRUBIN TOTAL: 0.3 mg/dL (ref 0.2–1.2)
BUN: 10 mg/dL (ref 6–23)
CO2: 27 mEq/L (ref 19–32)
CREATININE: 0.78 mg/dL (ref 0.40–1.20)
Calcium: 9.9 mg/dL (ref 8.4–10.5)
Chloride: 104 mEq/L (ref 96–112)
GFR: 84.81 mL/min (ref >60.00)
Glucose, Bld: 83 mg/dL (ref 70–99)
Potassium: 4.7 mEq/L (ref 3.5–5.1)
Sodium: 139 mEq/L (ref 135–145)
Total Protein: 7.2 g/dL (ref 6.0–8.3)

## 2019-01-18 LAB — MAGNESIUM: Magnesium: 1.9 mg/dL (ref 1.5–2.5)

## 2019-01-18 NOTE — Progress Notes (Signed)
Subjective:    Patient ID: Megan Tucker, female    DOB: 11/30/1985, 33 y.o.   MRN: [id number]  CC: Megan Tucker is a 33 y.o. female who presents today for follow up.   HPI: Hypertension-started hydrochlorothiazide last week. At work, 120/80.  Denies exertional chest pain or pressure, numbness or tingling radiating to left arm or jaw, b dizziness, frequent headaches, changes in vision, or shortness of breath.   Vaginal discharged resolved on MetroGel. On probiotics now.   Back pain resolved with rest.   'big' Cup of coffee this morning, HR is elevated on adderall , started when taking adderall years ago. Improves as day goes on. When not on adderall, wears apple watch and HR is less than 100. Doesn't appreciate HR being elevated.   H/o anemia; suspects endometriosis; considering hysterectomy        HISTORY:  Past Medical History:  Diagnosis Date  . Anemia   . Anemia   . Gestational diabetes    Past Surgical History:  Procedure Laterality Date  . MOUTH SURGERY    . TUBAL LIGATION  2014   Family History  Problem Relation Age of Onset  . Drug abuse Mother        narcotic addition  . Stroke Mother   . Hypertension Mother   . Mental illness Mother   . Diabetes Mother   . Diabetes Father   . Hypertension Maternal Grandmother   . Diabetes Maternal Grandmother   . Diabetes Maternal Grandfather   . Diabetes Paternal Grandmother   . Diabetes Paternal Grandfather   . Other Neg Hx     Allergies: Amlodipine and Adhesive [tape] Current Outpatient Medications on File Prior to Visit  Medication Sig Dispense Refill  . amphetamine-dextroamphetamine (ADDERALL XR) 20 MG 24 hr capsule Take 2 capsules (40 mg total) by mouth daily. 60 capsule 0  . cyclobenzaprine (FLEXERIL) 5 MG tablet Take 1 tablet (5 mg total) by mouth 3 (three) times daily as needed for muscle spasms. 30 tablet 1  . diclofenac sodium (VOLTAREN) 1 % GEL Apply 4 g topically 4 (four) times daily. 1 Tube 3  .  hydrochlorothiazide (MICROZIDE) 12.5 MG capsule Take 1 capsule (12.5 mg total) by mouth daily. 90 capsule 1  . lidocaine (LIDODERM) 5 % Place 1 patch onto the skin daily. Remove & Discard patch within 12 hours. 30 patch 0   No current facility-administered medications on file prior to visit.     Social History   Tobacco Use  . Smoking status: Former Games developer  . Smokeless tobacco: Never Used  Substance Use Topics  . Alcohol use: Yes    Alcohol/week: 0.0 - 1.0 standard drinks    Comment: glass of wine socially; once per month  . Drug use: No    Review of Systems  Constitutional: Negative for chills and fever.  Respiratory: Negative for cough and shortness of breath.   Cardiovascular: Negative for chest pain, palpitations and leg swelling.  Gastrointestinal: Negative for nausea and vomiting.  Musculoskeletal: Negative for back pain.      Objective:    BP 118/82 (BP Location: Right Arm, Patient Position: Sitting, Cuff Size: Large)   Pulse (!) 109   Temp 98.5 F (36.9 C)   Wt 179 lb 3.2 oz (81.3 kg)   SpO2 98%   BMI 30.76 kg/m  BP Readings from Last 3 Encounters:  01/18/19 118/82  01/11/19 (!) 150/98  12/27/18 (!) 158/104   Wt Readings from Last 3 Encounters:  01/18/19 179 lb 3.2 oz (81.3 kg)  01/11/19 182 lb 3.2 oz (82.6 kg)  12/27/18 177 lb 2 oz (80.3 kg)    Physical Exam Vitals signs reviewed.  Constitutional:      Appearance: She is well-developed.  Eyes:     Conjunctiva/sclera: Conjunctivae normal.  Cardiovascular:     Rate and Rhythm: Normal rate and regular rhythm.     Pulses: Normal pulses.     Heart sounds: Normal heart sounds.  Pulmonary:     Effort: Pulmonary effort is normal.     Breath sounds: Normal breath sounds. No wheezing, rhonchi or rales.  Skin:    General: Skin is warm and dry.  Neurological:     Mental Status: She is alert.  Psychiatric:        Speech: Speech normal.        Behavior: Behavior normal.        Thought Content: Thought  content normal.        Assessment & Plan:   Problem List Items Addressed This Visit      Cardiovascular and Mediastinum   HTN (hypertension) - Primary    Controlled , continue regimen.  Pending labs today.        Other   Menorrhagia with regular cycle    Patient will contact [facility], Dr Valentino Saxon. Referral placed      Relevant Orders   Ambulatory referral to Obstetrics / Gynecology   Tachycardia    Suspect  caffeine, taking Adderall prior to visit contributory.  Pending labs for magnesium, thyroid.  Patient declines EKG today.  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  She verbalized understanding of this.      Relevant Orders   TSH   Comprehensive metabolic panel   Magnesium       I am having Megan Nine "Awanda Mink" maintain her amphetamine-dextroamphetamine, hydrochlorothiazide, lidocaine, diclofenac sodium, and cyclobenzaprine.   No orders of the defined types were placed in this encounter.   Return precautions given.   Risks, benefits, and alternatives of the medications and treatment plan prescribed today were discussed, and patient expressed understanding.   Education regarding symptom management and diagnosis given to patient on AVS.  Continue to follow with Allegra Grana, FNP for routine health maintenance.   Megan Nine and I agreed with plan.   Rennie Plowman, FNP

## 2019-01-18 NOTE — Assessment & Plan Note (Signed)
Suspect  caffeine, taking Adderall prior to visit contributory.  Pending labs for magnesium, thyroid.  Patient declines EKG today.  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  She verbalized understanding of this.

## 2019-01-18 NOTE — Assessment & Plan Note (Addendum)
Controlled , continue regimen.  Pending labs today.

## 2019-01-18 NOTE — Assessment & Plan Note (Signed)
Patient will contact [facility], Dr Valentino Saxon. Referral placed

## 2019-01-18 NOTE — Patient Instructions (Addendum)
Please send me heart rate readings later today. Please refrain from any caffeine today as your heart is elevated.  If persists, we will need to pursue further evaluation.   Today we discussed referrals, orders. GYN Dr Valentino Saxon   I have placed these orders in the system for you.  Please be sure to give [place] a call if you have not heard from our office regarding this. We should hear from [place] within ONE week with information regarding your appointment. If not, please let me know immediately.     Palpitations Palpitations are feelings that your heartbeat is not normal. Your heartbeat may feel like it is:  Uneven.  Faster than normal.  Fluttering.  Skipping a beat. This is usually not a serious problem. In some cases, you may need tests to rule out any serious problems. Follow these instructions at home: Pay attention to any changes in your condition. Take these actions to help manage your symptoms: Eating and drinking  Avoid: ? Coffee, tea, soft drinks, and energy drinks. ? Chocolate. ? Alcohol. ? Diet pills. Lifestyle   Try to lower your stress. These things can help you relax: ? Yoga. ? Deep breathing and meditation. ? Exercise. ? Using words and images to create positive thoughts (guided imagery). ? Using your mind to control things in your body (biofeedback).  Do not use drugs.  Get plenty of rest and sleep. Keep a regular bed time. General instructions   Take over-the-counter and prescription medicines only as told by your doctor.  Do not use any products that contain nicotine or tobacco, such as cigarettes and e-cigarettes. If you need help quitting, ask your doctor.  Keep all follow-up visits as told by your doctor. This is important. You may need more tests if palpitations do not go away or get worse. Contact a doctor if:  Your symptoms last more than 24 hours.  Your symptoms occur more often. Get help right away if you:  Have chest pain.  Feel short of  breath.  Have a very bad headache.  Feel dizzy.  Pass out (faint). Summary  Palpitations are feelings that your heartbeat is uneven or faster than normal. It may feel like your heart is fluttering or skipping a beat.  Avoid food and drinks that may cause palpitations. These include caffeine, chocolate, and alcohol.  Try to lower your stress. Do not smoke or use drugs.  Get help right away if you faint or have chest pain, shortness of breath, a severe headache, or dizziness. This information is not intended to replace advice given to you by your health care provider. Make sure you discuss any questions you have with your health care provider. Document Released: 08/18/2008 Document Revised: 12/22/2017 Document Reviewed: 12/22/2017 Elsevier Interactive Patient Education  2019 [employer].

## 2019-01-19 LAB — TSH: TSH: 1.69 u[IU]/mL (ref 0.35–4.50)

## 2019-02-07 ENCOUNTER — Other Ambulatory Visit: Payer: Self-pay

## 2019-02-07 ENCOUNTER — Encounter: Payer: Self-pay | Admitting: Obstetrics and Gynecology

## 2019-02-07 ENCOUNTER — Ambulatory Visit (INDEPENDENT_AMBULATORY_CARE_PROVIDER_SITE_OTHER): Payer: 59 | Admitting: Obstetrics and Gynecology

## 2019-02-07 VITALS — BP 159/119 | HR 111 | Ht 64.0 in | Wt 177.8 lb

## 2019-02-07 DIAGNOSIS — N941 Unspecified dyspareunia: Secondary | ICD-10-CM | POA: Diagnosis not present

## 2019-02-07 DIAGNOSIS — Z9851 Tubal ligation status: Secondary | ICD-10-CM

## 2019-02-07 DIAGNOSIS — R102 Pelvic and perineal pain: Secondary | ICD-10-CM

## 2019-02-07 DIAGNOSIS — I10 Essential (primary) hypertension: Secondary | ICD-10-CM

## 2019-02-07 DIAGNOSIS — N920 Excessive and frequent menstruation with regular cycle: Secondary | ICD-10-CM | POA: Diagnosis not present

## 2019-02-07 DIAGNOSIS — N946 Dysmenorrhea, unspecified: Secondary | ICD-10-CM

## 2019-02-07 MED ORDER — ELAGOLIX SODIUM 150 MG PO TABS: 1.0000 | ORAL_TABLET | Freq: Every day | ORAL | 6 refills | Status: DC

## 2019-02-07 NOTE — Patient Instructions (Addendum)
Start taking Orilissa sample today.  Take 1 tablet daily. There will be a prescription at your pharmacy. Please use coupon card found inside of the information pamphlet given to you.      Laparoscopically Assisted Vaginal Hysterectomy A laparoscopically assisted vaginal hysterectomy (LAVH) is a surgical procedure to remove the uterus and cervix. Sometimes, the ovaries and fallopian tubes are also removed. This surgery may be done to treat problems such as:  Noncancerous growths in the uterus (uterine fibroids) that cause symptoms.  A condition that causes the lining of the uterus to grow in other areas (endometriosis).  Problems with pelvic support.  Cancer of the cervix, ovaries, uterus, or tissue that lines the uterus (endometrium).  Excessive (dysfunctional) uterine bleeding. During an LAVH, some of the surgical removal is done through the vagina, and the rest is done through a few small incisions in the abdomen. This technique may be an option for women who are not able to have a vaginal hysterectomy. Tell a health care provider about:  Any allergies you have.  All medicines you are taking, including vitamins, herbs, eye drops, creams, and over-the-counter medicines.  Any problems you or family members have had with anesthetic medicines.  Any blood disorders you have.  Any surgeries you have had.  Any medical conditions you have.  Whether you are pregnant or may be pregnant. What are the risks? Generally, this is a safe procedure. However, problems may occur, including:  Infection.  Bleeding.  Allergic reactions to medicines.  Damage to other structures or organs.  Difficulty breathing. What happens before the procedure? Staying hydrated Follow instructions from your health care provider about hydration, which may include:  Up to 2 hours before the procedure - you may continue to drink clear liquids, such as water, clear fruit juice, black coffee, and plain tea.  Eating and drinking restrictions Follow instructions from your health care provider about eating and drinking, which may include:  8 hours before the procedure - stop eating heavy meals or foods such as meat, fried foods, or fatty foods.  6 hours before the procedure - stop eating light meals or foods, such as toast or cereal.  6 hours before the procedure - stop drinking milk or drinks that contain milk.  2 hours before the procedure - stop drinking clear liquids. Medicines  Ask your health care provider about: ? Changing or stopping your regular medicines. This is especially important if you are taking diabetes medicines or blood thinners. ? Taking over-the-counter medicines, vitamins, herbs, and supplements. ? Taking medicines such as aspirin and ibuprofen. These medicines can thin your blood. Do not take these medicines unless your health care provider tells you to take them.  You may be asked to take a medicine to empty your colon (bowel preparation).  You may be given antibiotic medicine to help prevent infection. General instructions  Plan to have someone take you home from the hospital or clinic.  Ask your health care provider how your surgical site will be marked or identified.  You may be asked to shower with a germ-killing soap.  Do not use any products that contain nicotine or tobacco, such as cigarettes and e-cigarettes. These can delay healing after surgery. If you need help quitting, ask your health care provider. What happens during the procedure?  To lower your risk of infection: ? Your health care team will wash or sanitize their hands. ? Hair may be removed from the surgical area. ? Your skin will be washed  with soap.  An IV will be inserted into one of your veins.  You will be given one or more of the following: ? A medicine to help you relax (sedative). ? A medicine to make you fall asleep (general anesthetic).  You may have a flexible tube (catheter)  put into your bladder to drain urine.  You may have a tube put through your nose or mouth down into your stomach (nasogastric tube). The nasogastric tube will remove digestive fluids and prevent nausea and vomiting.  Tight-fitting (compression) stockings will be placed on your legs to promote circulation.  Three or four small incisions will be made in your abdomen. An incision will also be made in your vagina.  Probes and tools will be inserted into the small incisions. The uterus and cervix (and possibly the ovaries and fallopian tubes) will be removed through your vagina as well as through the small incisions that were made in the abdomen.  The incisions will then be closed with stitches (sutures). The procedure may vary among health care providers and hospitals. What happens after the procedure?  Your blood pressure, heart rate, breathing rate, and blood oxygen level will be monitored until the medicines you were given have worn off.  You may have a liquid diet at first. You will most likely return to your usual diet the day after surgery.  You will still have the urinary catheter in place. It will likely be removed the day after surgery.  You may have to wear compression stockings. These stockings help to prevent blood clots and reduce swelling in your legs.  You will be encouraged to walk as soon as possible. You will also use a device or do breathing exercises to keep your lungs clear.  Do not drive for 24 hours if you were given a sedative. Summary  A laparoscopically assisted vaginal hysterectomy (LAVH) is a surgical procedure to remove the uterus and cervix, and sometimes the ovaries and fallopian tubes.  Follow instructions from your health care provider about eating and drinking before the procedure.  During an LAVH, some of the surgical removal is done through the vagina, and the rest is done through a few small incisions in the abdomen. This information is not intended to  replace advice given to you by your health care provider. Make sure you discuss any questions you have with your health care provider. Document Released: 10/29/2011 Document Revised: 02/04/2017 Document Reviewed: 02/04/2017 Elsevier Interactive Patient Education  2019 [employer].

## 2019-02-07 NOTE — Progress Notes (Signed)
GYNECOLOGY PROGRESS NOTE  Subjective:    Patient ID: Megan Tucker, female    DOB: 07/11/1986, 33 y.o.   MRN: [id number]  HPI  Patient is a 33 y.o. [id number] female who presents for referral from midwife Doreene Burke CNM for consultation for hysterectomy.  She states that she has been having more frequent heavier periods over the past few years after the birth of her last child 6 years ago, and has progressively getting worse. Her cycles  last for 6-7 days with the first 3 days being the heaviest. She wears a tampon super plus and uses a pad that she changes every hour. She passes dime to nickel size clots. She states that she has always had some dysmenorrhea associated with her cycles (usually managed with Tylenol or Motrin), however in the past year she has had pelvic pain that may occur with her cycle but can occur outside of her cycle as well.  She does note some dyspareunia but is not with every occurrence. She has considered all options, and notes that several members in her family have also had to have hysterectomies at an early age due to reproductive issues and feels that this would be the best thing for her.  She has been on OCPs in the past which have not helped. She does have a history of tubal ligation.   Gynecologic History Patient's last menstrual period was 01/25/2019. Contraception: had tubal ligation  Last Pap: 7/ 2019. Results were: normal , negative HPV   OB History  Gravida Para Term Preterm AB Living  3 3 3  0 0 3  SAB TAB Ectopic Multiple Live Births  0 0 0 0 3    # Outcome Date GA Lbr Len/2nd Weight Sex Delivery Anes PTL Lv  3 Term 12/25/12 [redacted]w[redacted]d 09:08 / 00:27 8 lb 9 oz (3.885 kg) M Vag-Spont EPI  LIV  2 Term 10/28/06   8 lb 2 oz (3.685 kg) M Vag-Spont   LIV  1 Term 06/07/02   7 lb 2 oz (3.232 kg) M Vag-Spont   LIV    The following portions of the patient's history were reviewed and updated as appropriate: allergies, current medications, past family history, past  medical history, past social history, past surgical history and problem list.  Review of Systems Pertinent items noted in HPI and remainder of comprehensive ROS otherwise negative.   Objective:   Blood pressure (!) 159/119, pulse (!) 111, height 5\' 4"  (1.626 m), weight 177 lb 12.8 oz (80.6 kg), last menstrual period 01/25/2019. General appearance: alert and no distress Abdomen: soft, non-tender; bowel sounds normal; no masses,  no organomegaly Pelvic: external genitalia normal, rectovaginal septum normal.  Vagina without discharge.  Cervix normal appearing, no lesions and no motion tenderness.  Uterus mobile, nontender, normal shape and size. Good descensus noted.  Adnexae non-palpable, nontender bilaterally.  Extremities: extremities normal, atraumatic, no cyanosis or edema Neurologic: Grossly normal    Imaging:  US PELVIC COMPLETE WITH TRANSVAGINAL Patient Name: Megan Tucker DOB: 25-Jul-1986 MRN: [id number]  ULTRASOUND REPORT  Location: [facility]  Date of Service: 12/28/2018   Indications:AUB Findings:  The uterus is anteverted and measures 9.1x5.5x6.1cm. Echo texture is heterogenous with evidence of focal mass. A hyperechoic  area is seen in the anterior uterine wall, measuring  4x1.6 cm with other  evidences of adenomyosis ( a mottled inhomogeneous myometrial texture,  globular appearing uterus, small cystic spaces within the myometrium) The Endometrium measures 10.4 mm.  Right Ovary  measures 3.3x 1.8x2.8cm. It is normal in appearance. Left Ovary measures 2x1.7x2.2 cm. It is normal in appearance. Survey of the adnexa demonstrates no adnexal masses. There is a 1x1cm  free fluid in the posterior cul de sac.  Impression: 1. Echo texture is heterogenous with evidence of focal mass. A hyperechoic  area is seen in the anterior uterine wall, measuring  4x1.6 cm with other  evidences of adenomyosis (a mottled inhomogeneous myometrial texture,  globular appearing uterus,  small cystic spaces within the myometrium) 2. There is a 1x1cm  free fluid in the posterior cul de sac.  Recommendations: 1.Clinical correlation with the patient's History and Physical Exam.  Abeer Alsammarraie, RDMS  I have reviewed this study and agree with documented findings.   Hildred Laser, MD [facility]   Assessment:  Menorrhagia Pelvic pain Dysmenorrhea Dyspareunia Hypertension History of tubal ligation  Plan:   Discussion had with patient that based on her symptoms, she may have a condition called endometriosis (or adenomyosis based on recent ultrasound, however sonography is not a definitive diagnostic modality for adenomyosis).  Discussed management options for current symptoms, including contraception (IUD, progesterone only pills due to her uncontrolled HTN, Depo Provera), hormonal suppression with Aygestin, tranexamic acid, or surgical options including endometrial ablation or hysterectomy. Also discussed option of GnRH agonists including Lupron or Orilissa.  Patient still notes that she would prefer surgical intervention.  She no longer desires childbearing as she has had a tubal ligation.  Discussed that if surgical intervention is desired, would recommend an LAVH (instead of a TVH) in order to assess for endometrial implants abdominally as well as surgically remove uterus.  Patient notes understanding, would be ok with that plan.  She notes she would like to have her surgery in June or July.  Will have patient f/u in 3 months for a pre-operative appointment.  Offered medication management again until that time, with recommendations for trial of Orilissa. Patient ok to try.  Given 1 week sample in office and will prescribe. Will f/u in 3-4 weeks for symptoms.  - Hypertension uncontrolled today, patient notes that she did not take her medication this morning as she normally takes it around 11 a.m.  Also states that she was recently switched from Amlodipine to HCTZ ~  2-3 weeks ago. Advised that she would need to get BPs controlled prior to having a surgery.    A total of 25 minutes were spent face-to-face with the patient during this encounter and over half of that time involved counseling and coordination of care.   Hildred Laser, MD [facility]

## 2019-02-07 NOTE — Progress Notes (Signed)
Pt is present today to discuss having a hysterectomy due to having issues with irregular cycle.

## 2019-02-09 ENCOUNTER — Encounter (INDEPENDENT_AMBULATORY_CARE_PROVIDER_SITE_OTHER): Payer: 59 | Admitting: Family

## 2019-02-09 ENCOUNTER — Encounter: Payer: Self-pay | Admitting: Obstetrics and Gynecology

## 2019-02-09 DIAGNOSIS — I10 Essential (primary) hypertension: Secondary | ICD-10-CM

## 2019-02-10 ENCOUNTER — Telehealth: Payer: Self-pay

## 2019-02-10 NOTE — Telephone Encounter (Signed)
I called & LMTCB in regards to [employer]. Patient has high BP & needs appt ASAP.

## 2019-02-10 NOTE — Telephone Encounter (Signed)
Patient made appointment first thing for Monday morning. She did not have child care today & was concerned about Covid-19. She is at work all weekend where she can check her BP.

## 2019-02-13 ENCOUNTER — Other Ambulatory Visit: Payer: Self-pay

## 2019-02-13 ENCOUNTER — Ambulatory Visit: Payer: 59 | Admitting: Family

## 2019-02-13 MED ORDER — METOPROLOL SUCCINATE ER 25 MG PO TB24: 25.0000 mg | ORAL_TABLET | Freq: Every day | ORAL | 1 refills | Status: DC

## 2019-02-13 NOTE — Telephone Encounter (Signed)
Verbal consent for services obtained from patient prior to services given.  Names of all persons present for services: Rennie Plowman, NP Chief complaint: elevated blood pressure  History, background, results pertinent:   Ho HTN, ADHD.  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 No symptoms today and has worn appropriate PPE. Asked by front desk to stay in car for her and staff protection.   Spoke with patient on phone   for routine follow up.   Over the past week, has had elevated blood pressure. Notes first when seeing Dr Valentino Saxon  02/07/19, HR 111.  which was 59/119; this was prior to HCTZ.   'sometimes has a frontal headache' with blood pressure. No HA today. HA is not severe. Describes as a 'annoying headache.' Doesn't feel the need to have pain medication for ha when occurs.    NO CP, SOB, vision changes, left arm numbness.  Over past week , DBP has been in 100  When not on adderall, noted blood pressure is improved.    135/94 when not on adderall.    Plan:  HTN: uncontrolled. Asymptomatic today.  Start topro XR. Advised to take both HCTZ and Toprol qhs. Monitor BP and send me readings via mychart. She will stay vigilant in regards to any new symptoms, or worsening of HA.  Comfortable stopping with adderall for a couple of weeks. May consider decreasing adderall, or starting vyvanse at follow up.   Patient will call us or send [place] a mychart message in one week for follow up via telecommunication.  Time spent 15 minutes on the phone.

## 2019-03-02 ENCOUNTER — Telehealth: Payer: Self-pay

## 2019-03-02 NOTE — Telephone Encounter (Signed)
Pt called to get appropriate number for televisit on Monday, March 06, 2019.

## 2019-03-06 ENCOUNTER — Ambulatory Visit (INDEPENDENT_AMBULATORY_CARE_PROVIDER_SITE_OTHER): Payer: 59 | Admitting: Obstetrics and Gynecology

## 2019-03-06 ENCOUNTER — Encounter: Payer: Self-pay | Admitting: Obstetrics and Gynecology

## 2019-03-06 ENCOUNTER — Other Ambulatory Visit: Payer: Self-pay

## 2019-03-06 ENCOUNTER — Encounter: Payer: 59 | Admitting: Obstetrics and Gynecology

## 2019-03-06 VITALS — Ht 64.0 in | Wt 177.0 lb

## 2019-03-06 DIAGNOSIS — N941 Unspecified dyspareunia: Secondary | ICD-10-CM

## 2019-03-06 DIAGNOSIS — N920 Excessive and frequent menstruation with regular cycle: Secondary | ICD-10-CM | POA: Diagnosis not present

## 2019-03-06 DIAGNOSIS — R102 Pelvic and perineal pain: Secondary | ICD-10-CM

## 2019-03-06 DIAGNOSIS — I10 Essential (primary) hypertension: Secondary | ICD-10-CM

## 2019-03-06 MED ORDER — TRANEXAMIC ACID 650 MG PO TABS: 1300.0000 mg | ORAL_TABLET | Freq: Three times a day (TID) | ORAL | 2 refills | Status: DC

## 2019-03-06 NOTE — Progress Notes (Signed)
Virtual Visit via Telephone Note  I connected with [name] on 03/06/19 at  3:30 PM EDT by telephone and verified that I am speaking with the correct person using two identifiers.   I discussed the limitations, risks, security and privacy concerns of performing an evaluation and management service by telephone and the availability of in person appointments. I also discussed with the patient that there may be a patient responsible charge related to this service. The patient expressed understanding and agreed to proceed.   History of Present Illness: Megan Tucker is a 33 y.o. G92P3003 female who is presenting for televisit for follow up of pelvic pain, dyspareunia, and menorrhagia.  She ultimately desires definitive surgical management with hysterectomy, however elective surgeries are currently suspended due to COVID-19 crisis. Last visit she was given the option to initiate Orilissa for possible endometriosis symptoms.  Notes that after reading the side effect profile (especially concerns for suicidal ideation), she opted not to try the medication.   Of note, she states that she was seen by her PCP again and started on an additional medication to better manage her BPs.  She is now currenlty taking HCTZ as well as Toprol XL.  She notes her BPs are much better now, with diastolics in the 90s (previous BP in office was 159/119 on 02/07/19).     Observations/Objective: Today's Vitals   03/06/19 1526  Weight: 177 lb (80.3 kg)  Height: 5\' 4"  (1.626 m)   Body mass index is 30.38 kg/m.   Assessment and Plan: 1. Menorrhagia - discussion had with patient again regarding alternative options for her menorrhagia, including contraception (IUD, progesterone only pills due to her uncontrolled HTN, Depo Provera), hormonal suppression with Aygestin, or use of tranexamic acid.  Patient notes that she would like to try tranexamic acid (as she does not want to have to add another daily medication to her regimen  and desires something short term as she strongly desires surgical management for long-term management. Will prescribe.  2. Pelvic pain and dyspareunia- discussed that beginning tranexamic acid may help some with pain during cycles, but if pain is outside of cycles she may not experience much relief. Patient notes that she would rather just "deal with the pain" instead of being prescribed strong (or narcotic) pain medications.  Advised on continued use of ES Tylenol or NSAIDS as needed for pain.  3. Hypertension - improved control now on 2 med therapy.  Continue f/u with PCP.   Follow Up Instructions: Patient to f/u in 2 months for pre-operative appointment.  Desires hysterectomy. Will plan for LAVH with bilateral salpingectomy.    I discussed the assessment and treatment plan with the patient. The patient was provided an opportunity to ask questions and all were answered. The patient agreed with the plan and demonstrated an understanding of the instructions.   The patient was advised to call back or seek an in-person evaluation if the symptoms worsen or if the condition fails to improve as anticipated.  I provided 15 minutes of non-face-to-face time during this encounter.   Hildred Laser, MD  [facility]

## 2019-03-06 NOTE — Progress Notes (Signed)
Telephone visit. Pt routed from front desk. CC, vitals, medication and allergies reviewed and updated. Pt is having her flu visit for pelvic pain and starting Orilissa. Pt stated that she is still having pelvic pain but decided not to take the Orilissa due to the possible side effects.

## 2019-03-07 ENCOUNTER — Encounter: Payer: 59 | Admitting: Obstetrics and Gynecology

## 2019-03-09 ENCOUNTER — Other Ambulatory Visit: Payer: Self-pay | Admitting: Family

## 2019-03-09 DIAGNOSIS — F902 Attention-deficit hyperactivity disorder, combined type: Secondary | ICD-10-CM

## 2019-03-09 NOTE — Telephone Encounter (Signed)
Per patient her previous RX can not transfer so she needs a new RX sent to [facility].

## 2019-03-10 NOTE — Telephone Encounter (Signed)
Refilled: 12/28/2018 Last OV: 01/18/2019 Next OV: 03/15/2019

## 2019-03-13 ENCOUNTER — Encounter: Payer: Self-pay | Admitting: Family

## 2019-03-13 DIAGNOSIS — F902 Attention-deficit hyperactivity disorder, combined type: Secondary | ICD-10-CM

## 2019-03-13 MED ORDER — AMPHETAMINE-DEXTROAMPHET ER 20 MG PO CP24: 40.0000 mg | ORAL_CAPSULE | Freq: Every day | ORAL | 0 refills | Status: DC

## 2019-03-13 MED FILL — ADDERALL XR 20 MG CAP SA: 20 | 30 days supply | Qty: 60 | Fill #0

## 2019-03-13 NOTE — Telephone Encounter (Signed)
I looked up patient on [place] Controlled Substances Reporting System and saw no activity that raised concern of inappropriate use.    Maralyn Sago, would you call [facility] pharmacy and cancel adderal prescriptions?

## 2019-03-15 ENCOUNTER — Encounter: Payer: Self-pay | Admitting: Family

## 2019-03-15 ENCOUNTER — Ambulatory Visit (INDEPENDENT_AMBULATORY_CARE_PROVIDER_SITE_OTHER): Payer: 59 | Admitting: Family

## 2019-03-15 DIAGNOSIS — I10 Essential (primary) hypertension: Secondary | ICD-10-CM

## 2019-03-15 DIAGNOSIS — F902 Attention-deficit hyperactivity disorder, combined type: Secondary | ICD-10-CM

## 2019-03-15 MED ORDER — LISDEXAMFETAMINE DIMESYLATE 30 MG PO CAPS: 30.0000 mg | ORAL_CAPSULE | Freq: Every day | ORAL | 0 refills | Status: DC

## 2019-03-15 NOTE — Progress Notes (Signed)
This visit type was conducted due to national recommendations for restrictions regarding the COVID-19 pandemic (e.g. social distancing).  This format is felt to be most appropriate for this patient at this time.  All issues noted in this document were discussed and addressed.  No physical exam was performed (except for noted visual exam findings with Video Visits). Virtual Visit via Video Note  I connected with@  on 03/15/19 at  8:00 AM EDT by a video enabled telemedicine application and verified that I am speaking with the correct person using two identifiers.  Location patient: home Location provider:work Persons participating in the virtual visit: patient, provider  I discussed the limitations of evaluation and management by telemedicine and the availability of in person appointments. The patient expressed understanding and agreed to proceed.   HPI:  Feels well, no new complaints.   Hypertension- at home, 128/88, HR 110 on adderall. When not taiking adderall , has noticed HR was 90's.   started Toprol XR 1 month ago.on hctz . 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  HA resolved. No CP.   Adderall is contributory to elevated BP, HR; when stopped BP and HR improved. Had been on 20mg  adderall initially. Interested in trial of Vyvanse.   ROS: See pertinent positives and negatives per HPI.  Past Medical History:  Diagnosis Date  . Anemia   . Anemia   . Bacterial vaginitis   . Gestational diabetes   . Hypertension     Past Surgical History:  Procedure Laterality Date  . MOUTH SURGERY    . TUBAL LIGATION  2014    Family History  Problem Relation Age of Onset  . Drug abuse Mother        narcotic addition  . Stroke Mother   . Hypertension Mother   . Mental illness Mother   . Diabetes Mother   . Diabetes Father   . Hypertension Maternal Grandmother   . Diabetes Maternal Grandmother   . Diabetes Maternal Grandfather    . Diabetes Paternal Grandmother   . Diabetes Paternal Grandfather   . Other Neg Hx     SOCIAL HX: former smoker   Current Outpatient Medications:  .  cyclobenzaprine (FLEXERIL) 5 MG tablet, Take 1 tablet (5 mg total) by mouth 3 (three) times daily as needed for muscle spasms., Disp: 30 tablet, Rfl: 1 .  hydrochlorothiazide (MICROZIDE) 12.5 MG capsule, Take 1 capsule (12.5 mg total) by mouth daily., Disp: 90 capsule, Rfl: 1 .  metoprolol succinate (TOPROL-XL) 25 MG 24 hr tablet, Take 1 tablet (25 mg total) by mouth daily., Disp: 90 tablet, Rfl: 1 .  lidocaine (LIDODERM) 5 %, Place 1 patch onto the skin daily. Remove & Discard patch within 12 hours. (Patient not taking: Reported on 03/06/2019), Disp: 30 patch, Rfl: 0 .  lisdexamfetamine (VYVANSE) 30 MG capsule, Take 1 capsule (30 mg total) by mouth daily., Disp: 30 capsule, Rfl: 0 .  Probiotic Product (PROBIOTIC-10 PO), Take by mouth., Disp: , Rfl:  .  tranexamic acid (LYSTEDA) 650 MG TABS tablet, Take 2 tablets (1,300 mg total) by mouth 3 (three) times daily. Take during menses for a maximum of five days (Patient not taking: Reported on 03/15/2019), Disp: 30 tablet, Rfl: 2  EXAM:  VITALS per patient if applicable:  GENERAL: alert, oriented, appears well and in no acute distress  HEENT: atraumatic, conjunttiva clear, no obvious abnormalities on inspection of external nose and ears  NECK: normal movements  of the head and neck  LUNGS: on inspection no signs of respiratory distress, breathing rate appears normal, no obvious gross SOB, gasping or wheezing  CV: no obvious cyanosis  MS: moves all visible extremities without noticeable abnormality  PSYCH/NEURO: pleasant and cooperative, no obvious depression or anxiety, speech and thought processing grossly intact  ASSESSMENT AND PLAN:  Discussed the following assessment and plan:  Hypertension, unspecified type  Attention deficit hyperactivity disorder (ADHD), combined type - Plan:  lisdexamfetamine (VYVANSE) 30 MG capsule Problem List Items Addressed This Visit      Cardiovascular and Mediastinum   HTN (hypertension) - Primary    Remains slightly elevated however patient feels Adderall is likely contributory.  She declines increasing Toprol today, will do a trial of Vyvanse to see if any difference in heart rate, blood pressure.  Close f/u.        Other   Attention deficit hyperactivity disorder (ADHD)    Although Adderall has been effective, trial of Vyvanse to see if tachycardia, blood pressure may improve.  Discussed risk medication and addiction, patient understands it is controlled substance and to store in a safe place.      Relevant Medications   lisdexamfetamine (VYVANSE) 30 MG capsule         I discussed the assessment and treatment plan with the patient. The patient was provided an opportunity to ask questions and all were answered. The patient agreed with the plan and demonstrated an understanding of the instructions.   The patient was advised to call back or seek an in-person evaluation if the symptoms worsen or if the condition fails to improve as anticipated.   Rennie Plowman, FNP

## 2019-03-15 NOTE — Assessment & Plan Note (Signed)
Although Adderall has been effective, trial of Vyvanse to see if tachycardia, blood pressure may improve.  Discussed risk medication and addiction, patient understands it is controlled substance and to store in a safe place.

## 2019-03-15 NOTE — Assessment & Plan Note (Signed)
Remains slightly elevated however patient feels Adderall is likely contributory.  She declines increasing Toprol today, will do a trial of Vyvanse to see if any difference in heart rate, blood pressure.  Close f/u.

## 2019-03-15 NOTE — Patient Instructions (Signed)
Please discontinue Adderall as we discussed over video today.  Please be off ADDERALL medication for at least 4 days and then you may start the Vyvanse.  Please do remain very vigilant in regards to side effects including palpitations, increased anxiety, trouble sleeping.  We will have close follow-up in a month, or sooner if any questions or concerns.  Please ensure that you store the Vyvanse in a safe place, and also dispose of your remaining Adderall safely.  Monitor blood pressure,  Goal is less than 120/80, based on newest guidelines; if persistently higher, please make sooner follow up appointment so we can recheck you blood pressure and manage medications   Stay safe!

## 2019-04-14 ENCOUNTER — Ambulatory Visit (INDEPENDENT_AMBULATORY_CARE_PROVIDER_SITE_OTHER): Payer: 59 | Admitting: Family

## 2019-04-14 ENCOUNTER — Encounter: Payer: Self-pay | Admitting: Family

## 2019-04-14 DIAGNOSIS — R000 Tachycardia, unspecified: Secondary | ICD-10-CM

## 2019-04-14 DIAGNOSIS — F902 Attention-deficit hyperactivity disorder, combined type: Secondary | ICD-10-CM

## 2019-04-14 DIAGNOSIS — I10 Essential (primary) hypertension: Secondary | ICD-10-CM

## 2019-04-14 MED ORDER — LISDEXAMFETAMINE DIMESYLATE 10 MG PO CAPS: 10.0000 mg | ORAL_CAPSULE | Freq: Every day | ORAL | 0 refills | Status: DC

## 2019-04-14 NOTE — Progress Notes (Signed)
This visit type was conducted due to national recommendations for restrictions regarding the COVID-19 pandemic (e.g. social distancing).  This format is felt to be most appropriate for this patient at this time.  All issues noted in this document were discussed and addressed.  No physical exam was performed (except for noted visual exam findings with Video Visits). Virtual Visit via Video Note  I connected with@  on 04/14/19 at  8:00 AM EDT by a video enabled telemedicine application and verified that I am speaking with the correct person using two identifiers.  Location patient: home Location provider:work Persons participating in the virtual visit: patient, provider  I discussed the limitations of evaluation and management by telemedicine and the availability of in person appointments. The patient expressed understanding and agreed to proceed.  Interactive audio and video telecommunications were attempted between this provider and patient, however failed, due to patient having technical difficulties or patient did not have access to video capability.  We continued and completed visit with audio only.    HPI:  Feeling good, no complaints.   HTN- Notes HR is better controlled on Vyvanse. 124/79, HR 91. Denies exertional chest pain or pressure, numbness or tingling radiating to left arm or jaw, palpitations, dizziness, frequent headaches, changes in vision, or shortness of breath.    ADHD- really likes vyvanse; feels 'less tense and grumpy' when compared to adderall. Sleeping well. Has helped with focus. Notices that it wears off around 11am, after taking it 9am.    ROS: See pertinent positives and negatives per HPI.  Past Medical History:  Diagnosis Date  . Anemia   . Anemia   . Bacterial vaginitis   . Gestational diabetes   . Hypertension     Past Surgical History:  Procedure Laterality Date  . MOUTH SURGERY    . TUBAL LIGATION  2014    Family History  Problem Relation Age  of Onset  . Drug abuse Mother        narcotic addition  . Stroke Mother   . Hypertension Mother   . Mental illness Mother   . Diabetes Mother   . Diabetes Father   . Hypertension Maternal Grandmother   . Diabetes Maternal Grandmother   . Diabetes Maternal Grandfather   . Diabetes Paternal Grandmother   . Diabetes Paternal Grandfather   . Other Neg Hx     SOCIAL LI:DCVUDT smoker    Current Outpatient Medications:  .  hydrochlorothiazide (MICROZIDE) 12.5 MG capsule, Take 1 capsule (12.5 mg total) by mouth daily., Disp: 90 capsule, Rfl: 1 .  lisdexamfetamine (VYVANSE) 30 MG capsule, Take 1 capsule (30 mg total) by mouth daily., Disp: 30 capsule, Rfl: 0 .  metoprolol succinate (TOPROL-XL) 25 MG 24 hr tablet, Take 1 tablet (25 mg total) by mouth daily., Disp: 90 tablet, Rfl: 1 .  lisdexamfetamine (VYVANSE) 10 MG capsule, Take 1 capsule (10 mg total) by mouth daily., Disp: 30 capsule, Rfl: 0 .  tranexamic acid (LYSTEDA) 650 MG TABS tablet, Take 2 tablets (1,300 mg total) by mouth 3 (three) times daily. Take during menses for a maximum of five days (Patient not taking: Reported on 03/15/2019), Disp: 30 tablet, Rfl: 2    ASSESSMENT AND PLAN:  Discussed the following assessment and plan:  Attention deficit hyperactivity disorder (ADHD), combined type - Plan: lisdexamfetamine (VYVANSE) 10 MG capsule  Hypertension, unspecified type  Tachycardia  Problem List Items Addressed This Visit      Cardiovascular and Mediastinum   HTN (hypertension)  At goal. Continue regimen.         Other   Attention deficit hyperactivity disorder (ADHD) - Primary    Doing well on vyvanse. Dose wearing off. Trial increase to 40mg  qam. She will let me know how she is doing.      Relevant Medications   lisdexamfetamine (VYVANSE) 10 MG capsule   Tachycardia    Appears improved with Vyvanse ; advised close vigilance in regards to this.            I discussed the assessment and treatment plan  with the patient. The patient was provided an opportunity to ask questions and all were answered. The patient agreed with the plan and demonstrated an understanding of the instructions.   The patient was advised to call back or seek an in-person evaluation if the symptoms worsen or if the condition fails to improve as anticipated.   Rennie PlowmanMargaret Tifany Hirsch, FNP

## 2019-04-14 NOTE — Assessment & Plan Note (Addendum)
Doing well on vyvanse. Dose wearing off. Trial increase to 40mg  qam. She will let me know how she is doing.

## 2019-04-14 NOTE — Assessment & Plan Note (Signed)
Appears improved with Vyvanse ; advised close vigilance in regards to this.

## 2019-04-14 NOTE — Assessment & Plan Note (Signed)
At goal.  Continue regimen. 

## 2019-04-14 NOTE — Patient Instructions (Addendum)
Nice to speak with you!  Trial increase 10mg  ; total of 40mg  every morning.   Let me know how you feel on this medication.   Let me know if you need anything else.

## 2019-05-02 ENCOUNTER — Encounter: Payer: 59 | Admitting: Obstetrics and Gynecology

## 2019-05-04 ENCOUNTER — Encounter: Payer: Self-pay | Admitting: Family

## 2019-05-04 ENCOUNTER — Encounter: Payer: 59 | Admitting: Obstetrics and Gynecology

## 2019-05-05 ENCOUNTER — Encounter: Payer: Self-pay | Admitting: Family

## 2019-05-05 ENCOUNTER — Other Ambulatory Visit: Payer: Self-pay | Admitting: Family

## 2019-05-05 DIAGNOSIS — M545 Low back pain, unspecified: Secondary | ICD-10-CM

## 2019-05-05 MED ORDER — CYCLOBENZAPRINE HCL 5 MG PO TABS: 5.0000 mg | ORAL_TABLET | Freq: Every evening | ORAL | 1 refills | Status: DC | PRN

## 2019-06-02 ENCOUNTER — Other Ambulatory Visit: Payer: Self-pay | Admitting: Family

## 2019-06-02 DIAGNOSIS — F902 Attention-deficit hyperactivity disorder, combined type: Secondary | ICD-10-CM

## 2019-06-02 NOTE — Telephone Encounter (Signed)
Refilled: 10mg   04/14/2019    30mg   03/15/2019 Last OV: 04/14/2019 Next OV: not scheduled

## 2019-06-05 MED ORDER — LISDEXAMFETAMINE DIMESYLATE 10 MG PO CAPS: 10.0000 mg | ORAL_CAPSULE | Freq: Every day | ORAL | 0 refills | Status: DC

## 2019-06-05 MED ORDER — LISDEXAMFETAMINE DIMESYLATE 30 MG PO CAPS: 30.0000 mg | ORAL_CAPSULE | Freq: Every day | ORAL | 0 refills | Status: DC

## 2019-06-05 NOTE — Telephone Encounter (Signed)
Call pharmacy Please cancel script for vyvanse 10mg  in which I stated not to fill until 07/06/19. Patient is on total of 40mg  per day.  I sent in new rx for vyvanse 10mg  Please ensure there are 3 months of medication at pharmacy  I looked up patient on  [place] Controlled Substances Reporting System and saw no activity that raised concern of inappropriate use.

## 2019-06-12 ENCOUNTER — Telehealth: Payer: Self-pay

## 2019-06-12 NOTE — Telephone Encounter (Signed)
I spoke with patient & she is taking the vyvanse 40 mg total a day. Insurance is not wanting to pay for 10 mg capsule. She has enough to last her until refill is due. Can this be prescribed differently?

## 2019-06-14 MED ORDER — LISDEXAMFETAMINE DIMESYLATE 40 MG PO CAPS: 40.0000 mg | ORAL_CAPSULE | ORAL | 0 refills | Status: DC

## 2019-06-14 NOTE — Telephone Encounter (Signed)
Call pt  I can send in 40mg  vyvanse I send in new rx Let us know if any problems  I looked up patient on [place] Controlled Substances Reporting System and saw no activity that raised concern of inappropriate use.

## 2019-06-14 NOTE — Telephone Encounter (Signed)
LM that new dose was sent that should be covered by insurance.

## 2019-06-14 NOTE — Addendum Note (Signed)
Addended by: Burnard Hawthorne on: 06/14/2019 02:35 PM   Modules accepted: Orders

## 2019-08-16 ENCOUNTER — Ambulatory Visit: Payer: 59

## 2019-11-15 DIAGNOSIS — U071 COVID-19: Secondary | ICD-10-CM | POA: Diagnosis not present

## 2020-01-25 ENCOUNTER — Other Ambulatory Visit: Payer: Self-pay | Admitting: Family

## 2020-01-25 NOTE — Telephone Encounter (Signed)
Refill request for vyvanse , last seen 04-14-19, last filled 06-14-19.  Please advise.

## 2020-01-26 ENCOUNTER — Encounter: Payer: Self-pay | Admitting: Family

## 2020-01-26 ENCOUNTER — Other Ambulatory Visit: Payer: Self-pay | Admitting: Family

## 2020-01-26 ENCOUNTER — Telehealth: Payer: Self-pay | Admitting: Family

## 2020-01-26 MED ORDER — LISDEXAMFETAMINE DIMESYLATE 40 MG PO CAPS: 40.0000 mg | ORAL_CAPSULE | ORAL | 0 refills | Status: DC

## 2020-01-26 NOTE — Telephone Encounter (Signed)
I called and let patient know that Megan Tucker did have trouble sending, but looks like it had went on our end. Patient confirmed & said that she got notification from the pharmacy.

## 2020-01-26 NOTE — Telephone Encounter (Signed)
Call pt I have refilled 3 months - 3 separate scripts She needs f/u appt with me in the next 6-8 weeks as a controlled substance   I looked up patient on [place] Controlled Substances Reporting System and saw no activity that raised concern of inappropriate use.

## 2020-01-26 NOTE — Telephone Encounter (Signed)
Pt called and said she is completely out of Vyvanse. She said she has to work this weekend and really need to get it filled today.

## 2020-01-26 NOTE — Telephone Encounter (Signed)
rx request has already been sent to pcp

## 2020-01-26 NOTE — Telephone Encounter (Signed)
Pt scheduled 03/11/20.

## 2020-01-26 NOTE — Telephone Encounter (Signed)
Refill request for vyvanse , last seen 04-14-19, last filled 06-14-19.  Please advise.  

## 2020-01-26 NOTE — Addendum Note (Signed)
Addended by: Allegra Grana on: 01/26/2020 10:09 AM   Modules accepted: Orders

## 2020-03-11 ENCOUNTER — Ambulatory Visit: Payer: 59 | Admitting: Family

## 2020-03-11 ENCOUNTER — Encounter: Payer: Self-pay | Admitting: Family

## 2020-03-11 ENCOUNTER — Other Ambulatory Visit: Payer: Self-pay

## 2020-03-11 VITALS — BP 118/82 | HR 94 | Temp 97.1°F | Wt 195.0 lb

## 2020-03-11 DIAGNOSIS — F339 Major depressive disorder, recurrent, unspecified: Secondary | ICD-10-CM | POA: Diagnosis not present

## 2020-03-11 DIAGNOSIS — I10 Essential (primary) hypertension: Secondary | ICD-10-CM

## 2020-03-11 DIAGNOSIS — F902 Attention-deficit hyperactivity disorder, combined type: Secondary | ICD-10-CM | POA: Diagnosis not present

## 2020-03-11 LAB — COMPREHENSIVE METABOLIC PANEL
ALT: 19 U/L (ref 0–35)
AST: 18 U/L (ref 0–37)
Albumin: 4.5 g/dL (ref 3.5–5.2)
Alkaline Phosphatase: 56 U/L (ref 39–117)
BUN: 10 mg/dL (ref 6–23)
CO2: 25 mEq/L (ref 19–32)
Calcium: 9.6 mg/dL (ref 8.4–10.5)
Chloride: 103 mEq/L (ref 96–112)
Creatinine, Ser: 0.73 mg/dL (ref 0.40–1.20)
GFR: 90.93 mL/min (ref >60.00)
Glucose, Bld: 95 mg/dL (ref 70–99)
Potassium: 3.8 mEq/L (ref 3.5–5.1)
Sodium: 137 mEq/L (ref 135–145)
Total Bilirubin: 0.4 mg/dL (ref 0.2–1.2)
Total Protein: 7.4 g/dL (ref 6.0–8.3)

## 2020-03-11 LAB — TSH: TSH: 1.2 u[IU]/mL (ref 0.35–4.50)

## 2020-03-11 MED ORDER — BUPROPION HCL ER (XL) 150 MG PO TB24: ORAL_TABLET | ORAL | 3 refills | Status: DC

## 2020-03-11 NOTE — Patient Instructions (Addendum)
STOP vyvanse START  wellbutrin.   Bupropion extended-release tablets (Depression/Mood Disorders) What is this medicine? BUPROPION (byoo PROE pee on) is used to treat depression. This medicine may be used for other purposes; ask your health care provider or pharmacist if you have questions. COMMON BRAND NAME(S): Aplenzin, Budeprion XL, Forfivo XL, Wellbutrin XL What should I tell my health care provider before I take this medicine? They need to know if you have any of these conditions:  an eating disorder, such as anorexia or bulimia  bipolar disorder or psychosis  diabetes or high blood sugar, treated with medication  glaucoma  head injury or brain tumor  heart disease, previous heart attack, or irregular heart beat  high blood pressure  kidney or liver disease  seizures (convulsions)  suicidal thoughts or a previous suicide attempt  Tourette's syndrome  weight loss  an unusual or allergic reaction to bupropion, other medicines, foods, dyes, or preservatives  breast-feeding  pregnant or trying to become pregnant How should I use this medicine? Take this medicine by mouth with a glass of water. Follow the directions on the prescription label. You can take it with or without food. If it upsets your stomach, take it with food. Do not crush, chew, or cut these tablets. This medicine is taken once daily at the same time each day. Do not take your medicine more often than directed. Do not stop taking this medicine suddenly except upon the advice of your doctor. Stopping this medicine too quickly may cause serious side effects or your condition may worsen. A special MedGuide will be given to you by the pharmacist with each prescription and refill. Be sure to read this information carefully each time. Talk to your pediatrician regarding the use of this medicine in children. Special care may be needed. Overdosage: If you think you have taken too much of this medicine contact a  poison control center or emergency room at once. NOTE: This medicine is only for you. Do not share this medicine with others. What if I miss a dose? If you miss a dose, skip the missed dose and take your next tablet at the regular time. Do not take double or extra doses. What may interact with this medicine? Do not take this medicine with any of the following medications:  linezolid  MAOIs like Azilect, Carbex, Eldepryl, Marplan, Nardil, and Parnate  methylene blue (injected into a vein)  other medicines that contain bupropion like Zyban This medicine may also interact with the following medications:  alcohol  certain medicines for anxiety or sleep  certain medicines for blood pressure like metoprolol, propranolol  certain medicines for depression or psychotic disturbances  certain medicines for HIV or AIDS like efavirenz, lopinavir, nelfinavir, ritonavir  certain medicines for irregular heart beat like propafenone, flecainide  certain medicines for Parkinson's disease like amantadine, levodopa  certain medicines for seizures like carbamazepine, phenytoin, phenobarbital  cimetidine  clopidogrel  cyclophosphamide  digoxin  furazolidone  isoniazid  nicotine  orphenadrine  procarbazine  steroid medicines like prednisone or cortisone  stimulant medicines for attention disorders, weight loss, or to stay awake  tamoxifen  theophylline  thiotepa  ticlopidine  tramadol  warfarin This list may not describe all possible interactions. Give your health care provider a list of all the medicines, herbs, non-prescription drugs, or dietary supplements you use. Also tell them if you smoke, drink alcohol, or use illegal drugs. Some items may interact with your medicine. What should I watch for while using this medicine?  Tell your doctor if your symptoms do not get better or if they get worse. Visit your doctor or healthcare provider for regular checks on your  progress. Because it may take several weeks to see the full effects of this medicine, it is important to continue your treatment as prescribed by your doctor. This medicine may cause serious skin reactions. They can happen weeks to months after starting the medicine. Contact your healthcare provider right away if you notice fevers or flu-like symptoms with a rash. The rash may be red or purple and then turn into blisters or peeling of the skin. Or, you might notice a red rash with swelling of the face, lips or lymph nodes in your neck or under your arms. Patients and their families should watch out for new or worsening thoughts of suicide or depression. Also watch out for sudden changes in feelings such as feeling anxious, agitated, panicky, irritable, hostile, aggressive, impulsive, severely restless, overly excited and hyperactive, or not being able to sleep. If this happens, especially at the beginning of treatment or after a change in dose, call your healthcare provider. Avoid alcoholic drinks while taking this medicine. Drinking large amounts of alcoholic beverages, using sleeping or anxiety medicines, or quickly stopping the use of these agents while taking this medicine may increase your risk for a seizure. Do not drive or use heavy machinery until you know how this medicine affects you. This medicine can impair your ability to perform these tasks. Do not take this medicine close to bedtime. It may prevent you from sleeping. Your mouth may get dry. Chewing sugarless gum or sucking hard candy, and drinking plenty of water may help. Contact your doctor if the problem does not go away or is severe. The tablet shell for some brands of this medicine does not dissolve. This is normal. The tablet shell may appear whole in the stool. This is not a cause for concern. What side effects may I notice from receiving this medicine? Side effects that you should report to your doctor or health care professional as  soon as possible:  allergic reactions like skin rash, itching or hives, swelling of the face, lips, or tongue  breathing problems  changes in vision  confusion  elevated mood, decreased need for sleep, racing thoughts, impulsive behavior  fast or irregular heartbeat  hallucinations, loss of contact with reality  increased blood pressure  rash, fever, and swollen lymph nodes  redness, blistering, peeling or loosening of the skin, including inside the mouth  seizures  suicidal thoughts or other mood changes  unusually weak or tired  vomiting Side effects that usually do not require medical attention (report to your doctor or health care professional if they continue or are bothersome):  constipation  headache  loss of appetite  nausea  tremors  weight loss This list may not describe all possible side effects. Call your doctor for medical advice about side effects. You may report side effects to FDA at 1-800-FDA-1088. Where should I keep my medicine? Keep out of the reach of children. Store at room temperature between 15 and 30 degrees C (59 and 86 degrees F). Throw away any unused medicine after the expiration date. NOTE: This sheet is a summary. It may not cover all possible information. If you have questions about this medicine, talk to your doctor, pharmacist, or health care provider.  2020 Elsevier/Gold Standard (2019-02-02 13:45:31)

## 2020-03-11 NOTE — Assessment & Plan Note (Signed)
Continue Vyvanse at this time was not very effective with patient.  Will trial Wellbutrin

## 2020-03-11 NOTE — Assessment & Plan Note (Signed)
Worsened of late due to stress of children at home in virtual leanring environment, Covid. We  agreed trial of Wellbutrin may be helpful for  Depression, motivation, binge eating, weight gain and also ADHD.  She will STOP Vyvanse and understands not to take Vyvanse while on Wellbutrin.  She will let me know how she is doing Information in regards Wellbutrin given on her after visit summary

## 2020-03-11 NOTE — Assessment & Plan Note (Signed)
Well-controlled, reiterated to patient not to stop hydrochlorothiazide.  Continue regimen at this time

## 2020-03-11 NOTE — Progress Notes (Signed)
Subjective:    Patient ID: Megan Tucker, female    DOB: [birth year], 34 y.o.   MRN: [id number]  CC: Megan Tucker is a 34 y.o. female who presents today for follow up.   HPI: HTN - about a month ago, she had stopped hctz as felt blood presure had been so well controlled. She was at work, and it was 160/115 and then 150/101. Since then she has resumed HCTZ. Blood pressures at home, 120/80.    Denies leg swelling, exertional chest pain or pressure, numbness or tingling radiating to left arm or jaw, palpitations, dizziness, frequent headaches, changes in vision, or shortness of breath.   Concerned with weight gain. H/o binge eating disorder.  States 'cannot stop eating', eating candy. Stopped drinking soda. Walking with [employer]. Eating healthier. 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 Decreased motivation.  Sleeping well. No si/hi. Tried zoloft and wasn't very helpful.  No h/o anorexia, bulmia disorder, seizure. No alcohol use.   ADHD-not sure if Vyvanse is helpful. Takes only when at work. Sleeping well.   Back pain has improved. Doesn't think she needs flexeril anymore.    Planning hysterectomy with Dr Valentino Saxon; on hold for now as she cannot take time off from work right now.   HISTORY:  Past Medical History:  Diagnosis Date  . Anemia   . Anemia   . Bacterial vaginitis   . Gestational diabetes   . Hypertension    Past Surgical History:  Procedure Laterality Date  . MOUTH SURGERY    . TUBAL LIGATION  2014   Family History  Problem Relation Age of Onset  . Drug abuse Mother        narcotic addition  . Stroke Mother   . Hypertension Mother   . Mental illness Mother   . Diabetes Mother   . Diabetes Father   . Hypertension Maternal Grandmother   . Diabetes Maternal Grandmother   . Diabetes Maternal Grandfather   . Diabetes Paternal Grandmother   . Diabetes Paternal Grandfather   . Other Neg Hx     Allergies: Amlodipine and Adhesive  [tape] Current Outpatient Medications on File Prior to Visit  Medication Sig Dispense Refill  . hydrochlorothiazide (MICROZIDE) 12.5 MG capsule Take 1 capsule (12.5 mg total) by mouth daily. 90 capsule 1  . metoprolol succinate (TOPROL-XL) 25 MG 24 hr tablet Take 1 tablet (25 mg total) by mouth daily. 90 tablet 1   No current facility-administered medications on file prior to visit.    Social History   Tobacco Use  . Smoking status: Former Games developer  . Smokeless tobacco: Never Used  Substance Use Topics  . Alcohol use: Not Currently    Alcohol/week: 0.0 - 1.0 standard drinks    Comment: glass of wine socially; once per month  . Drug use: No    Review of Systems  Constitutional: Negative for chills and fever.  Respiratory: Negative for cough.   Cardiovascular: Negative for chest pain, palpitations and leg swelling.  Gastrointestinal: Negative for nausea and vomiting.  Musculoskeletal: Negative for back pain (resolved).  Neurological: Negative for headaches.  Psychiatric/Behavioral: Negative for sleep disturbance and suicidal ideas. The patient is not nervous/anxious.       Objective:    BP 118/82   Pulse 94   Temp (!) 97.1 F (36.2 C) (Temporal)   Wt 195 lb (88.5 kg)   SpO2 98%   BMI 33.47 kg/m  BP Readings from Last 3 Encounters:  03/11/20 118/82  02/07/19 (!) 159/119  01/18/19 118/82   Wt Readings from Last 3 Encounters:  03/11/20 195 lb (88.5 kg)  03/06/19 177 lb (80.3 kg)  02/07/19 177 lb 12.8 oz (80.6 kg)    Physical Exam Vitals reviewed.  Constitutional:      Appearance: She is well-developed.  Eyes:     Conjunctiva/sclera: Conjunctivae normal.  Cardiovascular:     Rate and Rhythm: Normal rate and regular rhythm.     Pulses: Normal pulses.     Heart sounds: Normal heart sounds.  Pulmonary:     Effort: Pulmonary effort is normal.     Breath sounds: Normal breath sounds. No wheezing, rhonchi or rales.  Skin:    General: Skin is warm and dry.   Neurological:     Mental Status: She is alert.  Psychiatric:        Speech: Speech normal.        Behavior: Behavior normal.        Thought Content: Thought content normal.        Assessment & Plan:   Problem List Items Addressed This Visit      Cardiovascular and Mediastinum   HTN (hypertension)    Well-controlled, reiterated to patient not to stop hydrochlorothiazide.  Continue regimen at this time      Relevant Orders   Comprehensive metabolic panel     Other   Attention deficit hyperactivity disorder (ADHD) - Primary    Continue Vyvanse at this time was not very effective with patient.  Will trial Wellbutrin      Depression, recurrent ([place])    Worsened of late due to stress of children at home in virtual lean[place] environment, Covid. We  agreed trial of Wellbutrin may be helpful for  Depression, motivation, binge eating, weight gain and also ADHD.  She will STOP Vyvanse and understands not to take Vyvanse while on Wellbutrin.  She will let me know how she is doing Information in regards Wellbutrin given on her after visit summary      Relevant Medications   buPROPion (WELLBUTRIN XL) 150 MG 24 hr tablet   Other Relevant Orders   Comprehensive metabolic panel   TSH       I have discontinued [name] "[place]"'s tranexamic acid, cyclobenzaprine, and lisdexamfetamine. I am also having her start on buPROPion. Additionally, I am having her maintain her hydrochlorothiazide and metoprolol succinate.   Meds ordered this encounter  Medications  . buPROPion (WELLBUTRIN XL) 150 MG 24 hr tablet    Sig: Start 150 mg ER PO qam, increase after 3 days to 300 mg qam.    Dispense:  60 tablet    Refill:  3    Order Specific Question:   Supervising Provider    Answer:   Crecencio Mc [[id number]]    Return precautions given.   Risks, benefits, and alternatives of the medications and treatment plan prescribed today were discussed, and patient expressed understanding.    Education regarding symptom management and diagnosis given to patient on AVS.  Continue to follow with Burnard Hawthorne, FNP for routine health maintenance.   [name] and I agreed with plan.   [name], FNP

## 2020-04-22 ENCOUNTER — Encounter: Payer: Self-pay | Admitting: Family

## 2020-04-22 DIAGNOSIS — F902 Attention-deficit hyperactivity disorder, combined type: Secondary | ICD-10-CM

## 2020-04-23 ENCOUNTER — Other Ambulatory Visit: Payer: Self-pay | Admitting: Family

## 2020-04-23 DIAGNOSIS — I10 Essential (primary) hypertension: Secondary | ICD-10-CM

## 2020-04-23 MED ORDER — HYDROCHLOROTHIAZIDE 12.5 MG PO CAPS: 12.5000 mg | ORAL_CAPSULE | Freq: Every day | ORAL | 3 refills | Status: DC

## 2020-04-23 MED ORDER — AMPHETAMINE-DEXTROAMPHET ER 20 MG PO CP24: 20.0000 mg | ORAL_CAPSULE | ORAL | 0 refills | Status: DC

## 2020-04-23 MED ORDER — METOPROLOL SUCCINATE ER 25 MG PO TB24: 25.0000 mg | ORAL_TABLET | Freq: Every day | ORAL | 3 refills | Status: DC

## 2020-04-23 NOTE — Telephone Encounter (Signed)
I looked up patient on [place] Controlled Substances Reporting System PMP AWARE and saw no activity that raised concern of inappropriate use.   

## 2020-05-15 ENCOUNTER — Ambulatory Visit: Payer: 59 | Admitting: Family

## 2020-05-31 ENCOUNTER — Encounter: Payer: Self-pay | Admitting: Family

## 2020-06-04 ENCOUNTER — Telehealth: Payer: Self-pay | Admitting: Family

## 2020-06-04 ENCOUNTER — Encounter: Payer: Self-pay | Admitting: Family

## 2020-06-04 ENCOUNTER — Telehealth (INDEPENDENT_AMBULATORY_CARE_PROVIDER_SITE_OTHER): Payer: 59 | Admitting: Family

## 2020-06-04 DIAGNOSIS — F902 Attention-deficit hyperactivity disorder, combined type: Secondary | ICD-10-CM | POA: Diagnosis not present

## 2020-06-04 DIAGNOSIS — I10 Essential (primary) hypertension: Secondary | ICD-10-CM

## 2020-06-04 DIAGNOSIS — F339 Major depressive disorder, recurrent, unspecified: Secondary | ICD-10-CM

## 2020-06-04 MED ORDER — METOPROLOL SUCCINATE ER 25 MG PO TB24: 25.0000 mg | ORAL_TABLET | Freq: Every day | ORAL | 3 refills | Status: DC

## 2020-06-04 MED ORDER — AMPHETAMINE-DEXTROAMPHET ER 20 MG PO CP24: 40.0000 mg | ORAL_CAPSULE | ORAL | 0 refills | Status: DC

## 2020-06-04 MED ORDER — HYDROCHLOROTHIAZIDE 12.5 MG PO CAPS: 12.5000 mg | ORAL_CAPSULE | Freq: Every day | ORAL | 3 refills | Status: DC

## 2020-06-04 NOTE — Assessment & Plan Note (Addendum)
Not at goal, increase to 40mg  adderall with close monitoring of blood pressure I looked up patient on [place] Controlled Substances Reporting System PMP AWARE and saw no activity that raised concern of inappropriate use.

## 2020-06-04 NOTE — Telephone Encounter (Signed)
Left message pt needs to schedule physical in 3 months

## 2020-06-04 NOTE — Assessment & Plan Note (Signed)
Stable. Continue regimen. 

## 2020-06-04 NOTE — Assessment & Plan Note (Signed)
Resolved at this time, will continue to monitor

## 2020-06-04 NOTE — Progress Notes (Signed)
Virtual Visit via Video Note  I connected with@  on 06/04/20 at  8:00 AM EDT by a video enabled telemedicine application and verified that I am speaking with the correct person using two identifiers.  Location patient: home Location provider:work  Persons participating in the virtual visit: patient, provider  I discussed the limitations of evaluation and management by telemedicine and the availability of in person appointments. The patient expressed understanding and agreed to proceed.   HPI: Feels well today No complaints. Children are out of school and stress is less. Enjoying reading more and also playing board games with kids. No depression.   ADHD- has been taking adderall XR 20 and feels that effects are waning with dose. Would like 40mg  tablet.  No increased anxiety, trouble sleeping.  HTN- has been 'great' on adderall.  Compliant with toprol and HCTZ. Today, 123/89. Most days DBP 80, and SBP no higher than 120.  Denies exertional chest pain or pressure, numbness or tingling radiating to left arm or jaw, palpitations, dizziness, frequent headaches, changes in vision, or shortness of breath.     ROS: See pertinent positives and negatives per HPI.  Past Medical History:  Diagnosis Date  . Anemia   . Anemia   . Bacterial vaginitis   . Gestational diabetes   . Hypertension     Past Surgical History:  Procedure Laterality Date  . MOUTH SURGERY    . TUBAL LIGATION  2014    Family History  Problem Relation Age of Onset  . Drug abuse Mother        narcotic addition  . Stroke Mother   . Hypertension Mother   . Mental illness Mother   . Diabetes Mother   . Diabetes Father   . Hypertension Maternal Grandmother   . Diabetes Maternal Grandmother   . Diabetes Maternal Grandfather   . Diabetes Paternal Grandmother   . Diabetes Paternal Grandfather   . Other Neg Hx       Current Outpatient Medications:  .  amphetamine-dextroamphetamine (ADDERALL XR) 20 MG 24 hr  capsule, Take 2 capsules (40 mg total) by mouth every morning., Disp: 60 capsule, Rfl: 0 .  hydrochlorothiazide (MICROZIDE) 12.5 MG capsule, Take 1 capsule (12.5 mg total) by mouth daily., Disp: 90 capsule, Rfl: 3 .  metoprolol succinate (TOPROL-XL) 25 MG 24 hr tablet, Take 1 tablet (25 mg total) by mouth daily., Disp: 90 tablet, Rfl: 3  EXAM:  VITALS per patient if applicable:  GENERAL: alert, oriented, appears well and in no acute distress  HEENT: atraumatic, conjunttiva clear, no obvious abnormalities on inspection of external nose and ears  NECK: normal movements of the head and neck  LUNGS: on inspection no signs of respiratory distress, breathing rate appears normal, no obvious gross SOB, gasping or wheezing  CV: no obvious cyanosis  MS: moves all visible extremities without noticeable abnormality  PSYCH/NEURO: pleasant and cooperative, no obvious depression or anxiety, speech and thought processing grossly intact  ASSESSMENT AND PLAN:  Discussed the following assessment and plan:  Hypertension, unspecified type - Plan: hydrochlorothiazide (MICROZIDE) 12.5 MG capsule, metoprolol succinate (TOPROL-XL) 25 MG 24 hr tablet  Attention deficit hyperactivity disorder (ADHD), combined type  Depression, recurrent (H[facility]) Problem List Items Addressed This Visit      Cardiovascular and Mediastinum   HTN (hypertension)    Stable. Continue regimen.       Relevant Medications   hydrochlorothiazide (MICROZIDE) 12.5 MG capsule   metoprolol succinate (TOPROL-XL) 25 MG 24 hr tablet  Other   Attention deficit hyperactivity disorder (ADHD)    Not at goal, increase to 40mg  adderall with close monitoring of blood pressure I looked up patient on [place] Controlled Substances Reporting System PMP AWARE and saw no activity that raised concern of inappropriate use.        Depression, recurrent (HCC)    Resolved at this time, will continue to monitor         -we discussed possible  serious and likely etiologies, options for evaluation and workup, limitations of telemedicine visit vs in person visit, treatment, treatment risks and precautions. Pt prefers to treat via telemedicine empirically rather then risking or undertaking an in person visit at this moment. Patient agrees to seek prompt in person care if worsening, new symptoms arise, or if is not improving with treatment.   I discussed the assessment and treatment plan with the patient. The patient was provided an opportunity to ask questions and all were answered. The patient agreed with the plan and demonstrated an understanding of the instructions.   The patient was advised to call back or seek an in-person evaluation if the symptoms worsen or if the condition fails to improve as anticipated.   , FNP

## 2020-06-10 ENCOUNTER — Ambulatory Visit: Payer: 59 | Admitting: Family

## 2020-08-07 ENCOUNTER — Other Ambulatory Visit: Payer: Self-pay | Admitting: Family

## 2020-08-09 ENCOUNTER — Other Ambulatory Visit: Payer: Self-pay | Admitting: Family

## 2020-08-09 ENCOUNTER — Telehealth: Payer: Self-pay | Admitting: Family

## 2020-08-09 ENCOUNTER — Encounter: Payer: Self-pay | Admitting: Family

## 2020-08-09 MED ORDER — AMPHETAMINE-DEXTROAMPHET ER 20 MG PO CP24: 40.0000 mg | ORAL_CAPSULE | Freq: Every morning | ORAL | 0 refills | Status: DC

## 2020-08-09 MED ORDER — AMPHETAMINE-DEXTROAMPHET ER 20 MG PO CP24: 40.0000 mg | ORAL_CAPSULE | ORAL | 0 refills | Status: DC

## 2020-08-09 NOTE — Telephone Encounter (Signed)
Call pt Medication sent, 3 month supply, 3 scripts

## 2020-08-09 NOTE — Telephone Encounter (Signed)
Patient called stated the pharmacy dose not have her prescription for amphetamine-dextroamphetamine (ADDERALL XR) 20 MG 24 hr capsule

## 2020-08-09 NOTE — Telephone Encounter (Signed)
Mychart messages was sent & patient did see.

## 2020-08-26 ENCOUNTER — Encounter: Payer: 59 | Admitting: Family

## 2020-10-02 ENCOUNTER — Other Ambulatory Visit: Payer: Self-pay

## 2020-10-04 ENCOUNTER — Other Ambulatory Visit: Payer: Self-pay

## 2020-10-04 ENCOUNTER — Ambulatory Visit (INDEPENDENT_AMBULATORY_CARE_PROVIDER_SITE_OTHER): Payer: 59 | Admitting: Family

## 2020-10-04 ENCOUNTER — Encounter: Payer: Self-pay | Admitting: Family

## 2020-10-04 VITALS — BP 150/100 | HR 99 | Temp 99.0°F | Ht 64.95 in | Wt 195.3 lb

## 2020-10-04 DIAGNOSIS — Z0000 Encounter for general adult medical examination without abnormal findings: Secondary | ICD-10-CM

## 2020-10-04 DIAGNOSIS — F902 Attention-deficit hyperactivity disorder, combined type: Secondary | ICD-10-CM

## 2020-10-04 DIAGNOSIS — I10 Essential (primary) hypertension: Secondary | ICD-10-CM | POA: Diagnosis not present

## 2020-10-04 DIAGNOSIS — N644 Mastodynia: Secondary | ICD-10-CM

## 2020-10-04 NOTE — Assessment & Plan Note (Signed)
Elevated today. Reassured by normal neurologic exam and no signs or symptoms of HTN urgency or emergency at this time. Question whether related to NSAID use. She is compliant with hctz 12.5mg . Advised to start metoprolol 25mg  xr if blood pressure continues to be elevated > 120/80. She will keep a log over the weekend.

## 2020-10-04 NOTE — Progress Notes (Signed)
Subjective:    Patient ID: Megan Tucker, female    DOB: [birth year], 34 y.o.   MRN: [id number]  CC: Megan Tucker is a 34 y.o. female who presents today for physical exam and follow up    HPI: HTN- compliant with hctz 12.5mg  . She is not taken metoprolol 25mg .  At work, 128/88 and this on adderall 40mg  . Denies exertional chest pain or pressure, numbness or tingling radiating to left arm or jaw, palpitations, dizziness, frequent headaches, changes in vision, or shortness of breath.  Took two naprosyn this morning as low back bothering her after moving patient yesterday at work.   ADHD- symptoms controlled on adderall 40mg  on work days only.  No increased anxiety, trouble sleeping.   Complains left breast mass proximal to axilla noticed one month ago. Comes and goes. Movable.  Felt it in the shower. Mass is tender and size has not enlarged.  Not lactating. No nipple discharge, fever, chills. Weight loss.       Colorectal Cancer Screening: no early family history Breast Cancer Screening: no early family history Cervical Cancer Screening: UTD   Lung Cancer Screening: Doesn't have 30 year pack year history and age > 29 years yo 5 years  No family history of AAA.        Tetanus - utd   Hepatitis C screening - Candidate for, declines  Labs: Declines creening labs today. Exercise: Gets regular exercise.   Alcohol use:  occassionally Smoking/tobacco use: former smoker.     HISTORY:  Past Medical History:  Diagnosis Date  . Anemia   . Anemia   . Bacterial vaginitis   . Gestational diabetes   . Hypertension     Past Surgical History:  Procedure Laterality Date  . MOUTH SURGERY    . TUBAL LIGATION  2014   Family History  Problem Relation Age of Onset  . Drug abuse Mother        narcotic addition  . Stroke Mother   . Hypertension Mother   . Mental illness Mother   . Diabetes Mother   . Diabetes Father   . Hypertension Maternal Grandmother   . Diabetes Maternal  Grandmother   . Diabetes Maternal Grandfather   . Diabetes Paternal Grandmother   . Diabetes Paternal Grandfather   . Other Neg Hx       ALLERGIES: Amlodipine and Adhesive [tape]  Current Outpatient Medications on File Prior to Visit  Medication Sig Dispense Refill  . amphetamine-dextroamphetamine (ADDERALL XR) 20 MG 24 hr capsule Take 2 capsules (40 mg total) by mouth every morning. 60 capsule 0  . amphetamine-dextroamphetamine (ADDERALL XR) 20 MG 24 hr capsule Take 2 capsules (40 mg total) by mouth every morning. 60 capsule 0  . amphetamine-dextroamphetamine (ADDERALL XR) 20 MG 24 hr capsule Take 2 capsules (40 mg total) by mouth every morning. 60 capsule 0  . hydrochlorothiazide (MICROZIDE) 12.5 MG capsule Take 1 capsule (12.5 mg total) by mouth daily. 90 capsule 3  . metoprolol succinate (TOPROL-XL) 25 MG 24 hr tablet Take 1 tablet (25 mg total) by mouth daily. 90 tablet 3   No current facility-administered medications on file prior to visit.    Social History   Tobacco Use  . Smoking status: Former  . Smokeless tobacco: Never Used  Vaping Use  . Vaping Use: Never used  Substance Use Topics  . Alcohol use: Not Currently    Alcohol/week: 0.0 - 1.0 standard drinks    Comment: glass  of wine socially; once per month  . Drug use: No    Review of Systems  Constitutional: Negative for chills, fever and unexpected weight change.  HENT: Negative for congestion.   Respiratory: Negative for cough.   Cardiovascular: Negative for chest pain, palpitations and leg swelling.  Gastrointestinal: Negative for nausea and vomiting.  Musculoskeletal: Negative for arthralgias and myalgias.  Skin: Negative for rash.  Neurological: Negative for headaches.  Hematological: Negative for adenopathy.  Psychiatric/Behavioral: Negative for confusion, decreased concentration, sleep disturbance and suicidal ideas. The patient is not nervous/anxious.       Objective:    BP (!) 150/100    Pulse 99   Temp 99 F (37.2 C)   Ht 5' 4.95" (1.65 m)   Wt 195 lb 4.8 oz (88.6 kg)   SpO2 97%   BMI 32.55 kg/m   BP Readings from Last 3 Encounters:  10/04/20 (!) 150/100  06/04/20 123/89  03/11/20 118/82   Wt Readings from Last 3 Encounters:  10/04/20 195 lb 4.8 oz (88.6 kg)  06/04/20 195 lb (88.5 kg)  03/11/20 195 lb (88.5 kg)    Physical Exam Vitals reviewed.  Constitutional:      Appearance: She is well-developed.  HENT:     Mouth/Throat:     Pharynx: Uvula midline.  Eyes:     Conjunctiva/sclera: Conjunctivae normal.     Pupils: Pupils are equal, round, and reactive to light.     Comments: Fundus normal bilaterally.   Neck:     Thyroid: No thyroid mass or thyromegaly.  Cardiovascular:     Rate and Rhythm: Normal rate and regular rhythm.     Pulses: Normal pulses.     Heart sounds: Normal heart sounds.  Pulmonary:     Effort: Pulmonary effort is normal.     Breath sounds: Normal breath sounds. No wheezing, rhonchi or rales.  Chest:     Breasts: Breasts are symmetrical.        Right: No inverted nipple, mass, nipple discharge, skin change or tenderness.        Left: Tenderness present. No inverted nipple, mass, nipple discharge or skin change.       Comments: Density and tenderness noted approx 3 oclock proximal to axilla. No rash, erythema, or increased warmth Lymphadenopathy:     Head:     Right side of head: No submental, submandibular, tonsillar, preauricular, posterior auricular or occipital adenopathy.     Left side of head: No submental, submandibular, tonsillar, preauricular, posterior auricular or occipital adenopathy.     Cervical: No cervical adenopathy.     Right cervical: No superficial, deep or posterior cervical adenopathy.    Left cervical: No superficial, deep or posterior cervical adenopathy.  Skin:    General: Skin is warm and dry.  Neurological:     Mental Status: She is alert.     Cranial Nerves: No cranial nerve deficit.     Sensory:  No sensory deficit.     Deep Tendon Reflexes:     Reflex Scores:      Bicep reflexes are 2+ on the right side and 2+ on the left side.      Patellar reflexes are 2+ on the right side and 2+ on the left side.    Comments: Grip equal and strong bilateral upper extremities. Gait strong and steady. Able to perform rapid alternating movement without difficulty.   Psychiatric:        Speech: Speech normal.  Behavior: Behavior normal.        Thought Content: Thought content normal.        Assessment & Plan:   Problem List Items Addressed This Visit      Cardiovascular and Mediastinum   HTN (hypertension)    Elevated today. Reassured by normal neurologic exam and no signs or symptoms of HTN urgency or emergency at this time. Question whether related to NSAID use. She is compliant with hctz 12.5mg . Advised to start metoprolol 25mg  xr if blood pressure continues to be elevated > 120/80. She will keep a log over the weekend.         Other   Attention deficit hyperactivity disorder (ADHD)    Stable, continue adderall 40mg  xl.       Breast tenderness    Pending diagnostic images, due to work schedule, patient will schedule diagnostic image and I have provided phone number for her to schedule.      Relevant Orders   MM DIAG BREAST TOMO BILATERAL   MM DIAG BREAST TOMO UNI LEFT   Routine physical examination - Primary    Pap smear UTD. Deferred pelvic exam in the absence of concerns. Encouraged exercise.           I am having " " maintain her hydrochlorothiazide, metoprolol succinate, amphetamine-dextroamphetamine, amphetamine-dextroamphetamine, and amphetamine-dextroamphetamine.   No orders of the defined types were placed in this encounter.   Return precautions given.   Risks, benefits, and alternatives of the medications and treatment plan prescribed today were discussed, and patient expressed understanding.   Education regarding symptom  management and diagnosis given to patient on AVS.   Continue to follow with Tyrone Nine, FNP for routine health maintenance.   Awanda Mink and I agreed with plan.   Allegra Grana, FNP

## 2020-10-04 NOTE — Assessment & Plan Note (Signed)
Pending diagnostic images, due to work schedule, patient will schedule diagnostic image and I have provided phone number for her to schedule.

## 2020-10-04 NOTE — Patient Instructions (Addendum)
Please call  and schedule your 3D mammogram as discussed.    [facility]  [address]  [place], [place]  [phone]   If blood pressure remains > 120/80, please start metoprolol 25mg  as we discussed.  Please send me blood pressure readings as well  It is imperative that you are seen AT least twice per year for labs and monitoring. Monitor blood pressure at home and me 5-6 reading on separate days. Goal is less than 120/80, based on newest guidelines, however we certainly want to be less than 130/80;  if persistently higher, please make sooner follow up appointment so we can recheck you blood pressure and manage/ adjust medications.    Health Maintenance, Female Adopting a healthy lifestyle and getting preventive care are important in promoting health and wellness. Ask your health care provider about:  The right schedule for you to have regular tests and exams.  Things you can do on your own to prevent diseases and keep yourself healthy. What should I know about diet, weight, and exercise? Eat a healthy diet   Eat a diet that includes plenty of vegetables, fruits, low-fat dairy products, and lean protein.  Do not eat a lot of foods that are high in solid fats, added sugars, or sodium. Maintain a healthy weight Body mass index (BMI) is used to identify weight problems. It estimates body fat based on height and weight. Your health care provider can help determine your BMI and help you achieve or maintain a healthy weight. Get regular exercise Get regular exercise. This is one of the most important things you can do for your health. Most adults should:  Exercise for at least 150 minutes each week. The exercise should increase your heart rate and make you sweat (moderate-intensity exercise).  Do strengthening exercises at least twice a week. This is in addition to the moderate-intensity exercise.  Spend less time sitting. Even light physical activity can  be beneficial. Watch cholesterol and blood lipids Have your blood tested for lipids and cholesterol at 35 years of age, then have this test every 5 years. Have your cholesterol levels checked more often if:  Your lipid or cholesterol levels are high.  You are older than 35 years of age.  You are at high risk for heart disease. What should I know about cancer screening? Depending on your health history and family history, you may need to have cancer screening at various ages. This may include screening for:  Breast cancer.  Cervical cancer.  Colorectal cancer.  Skin cancer.  Lung cancer. What should I know about heart disease, diabetes, and high blood pressure? Blood pressure and heart disease  High blood pressure causes heart disease and increases the risk of stroke. This is more likely to develop in people who have high blood pressure readings, are of African descent, or are overweight.  Have your blood pressure checked: ? Every 3-5 years if you are 39-70 years of age. ? Every year if you are 27 years old or older. Diabetes Have regular diabetes screenings. This checks your fasting blood sugar level. Have the screening done:  Once every three years after age 46 if you are at a normal weight and have a low risk for diabetes.  More often and at a younger age if you are overweight or have a high risk for diabetes. What should I know about preventing infection? Hepatitis B If you have a higher risk for hepatitis B, you should be screened for  this virus. Talk with your health care provider to find out if you are at risk for hepatitis B infection. Hepatitis C Testing is recommended for:  Everyone born from [birth year] through 1966.  Anyone with known risk factors for hepatitis C. Sexually transmitted infections (STIs)  Get screened for STIs, including gonorrhea and chlamydia, if: ? You are sexually active and are younger than 35 years of age. ? You are older than 35 years of age  and your health care provider tells you that you are at risk for this type of infection. ? Your sexual activity has changed since you were last screened, and you are at increased risk for chlamydia or gonorrhea. Ask your health care provider if you are at risk.  Ask your health care provider about whether you are at high risk for HIV. Your health care provider may recommend a prescription medicine to help prevent HIV infection. If you choose to take medicine to prevent HIV, you should first get tested for HIV. You should then be tested every 3 months for as long as you are taking the medicine. Pregnancy  If you are about to stop having your period (premenopausal) and you may become pregnant, seek counseling before you get pregnant.  Take 400 to 800 micrograms (mcg) of folic acid every day if you become pregnant.  Ask for birth control (contraception) if you want to prevent pregnancy. Osteoporosis and menopause Osteoporosis is a disease in which the bones lose minerals and strength with aging. This can result in bone fractures. If you are 54 years old or older, or if you are at risk for osteoporosis and fractures, ask your health care provider if you should:  Be screened for bone loss.  Take a calcium or vitamin D supplement to lower your risk of fractures.  Be given hormone replacement therapy (HRT) to treat symptoms of menopause. Follow these instructions at home: Lifestyle  Do not use any products that contain nicotine or tobacco, such as cigarettes, e-cigarettes, and chewing tobacco. If you need help quitting, ask your health care provider.  Do not use street drugs.  Do not share needles.  Ask your health care provider for help if you need support or information about quitting drugs. Alcohol use  Do not drink alcohol if: ? Your health care provider tells you not to drink. ? You are pregnant, may be pregnant, or are planning to become pregnant.  If you drink alcohol: ? Limit how  much you use to 0-1 drink a day. ? Limit intake if you are breastfeeding.  Be aware of how much alcohol is in your drink. In the [place] one drink equals one 12 oz bottle of beer (355 mL), one 5 oz glass of wine (148 mL), or one 1 oz glass of hard liquor (44 mL). General instructions  Schedule regular health, dental, and eye exams.  Stay current with your vaccines.  Tell your health care provider if: ? You often feel depressed. ? You have ever been abused or do not feel safe at home. Summary  Adopting a healthy lifestyle and getting preventive care are important in promoting health and wellness.  Follow your health care provider's instructions about healthy diet, exercising, and getting tested or screened for diseases.  Follow your health care provider's instructions on monitoring your cholesterol and blood pressure. This information is not intended to replace advice given to you by your health care provider. Make sure you discuss any questions you have with your health care provider.  Document Revised: 11/02/2018 Document Reviewed: 11/02/2018 Elsevier Patient Education  2020 [employer].  Managing Your Hypertension Hypertension is commonly called high blood pressure. This is when the force of your blood pressing against the walls of your arteries is too strong. Arteries are blood vessels that carry blood from your heart throughout your body. Hypertension forces the heart to work harder to pump blood, and may cause the arteries to become narrow or stiff. Having untreated or uncontrolled hypertension can cause heart attack, stroke, kidney disease, and other problems. What are blood pressure readings? A blood pressure reading consists of a higher number over a lower number. Ideally, your blood pressure should be below 120/80. The first ("top") number is called the systolic pressure. It is a measure of the pressure in your arteries as your heart beats. The second ("bottom") number is called  the diastolic pressure. It is a measure of the pressure in your arteries as the heart relaxes. What does my blood pressure reading mean? Blood pressure is classified into four stages. Based on your blood pressure reading, your health care provider may use the following stages to determine what type of treatment you need, if any. Systolic pressure and diastolic pressure are measured in a unit called mm Hg. Normal  Systolic pressure: below 120.  Diastolic pressure: below 80. Elevated  Systolic pressure: 120-129.  Diastolic pressure: below 80. Hypertension stage 1  Systolic pressure: 130-139.  Diastolic pressure: 80-89. Hypertension stage 2  Systolic pressure: 140 or above.  Diastolic pressure: 90 or above. What health risks are associated with hypertension? Managing your hypertension is an important responsibility. Uncontrolled hypertension can lead to:  A heart attack.  A stroke.  A weakened blood vessel (aneurysm).  Heart failure.  Kidney damage.  Eye damage.  Metabolic syndrome.  Memory and concentration problems. What changes can I make to manage my hypertension? Hypertension can be managed by making lifestyle changes and possibly by taking medicines. Your health care provider will help you make a plan to bring your blood pressure within a normal range. Eating and drinking   Eat a diet that is high in fiber and potassium, and low in salt (sodium), added sugar, and fat. An example eating plan is called the DASH (Dietary Approaches to Stop Hypertension) diet. To eat this way: ? Eat plenty of fresh fruits and vegetables. Try to fill half of your plate at each meal with fruits and vegetables. ? Eat whole grains, such as whole wheat pasta, brown rice, or whole grain bread. Fill about one quarter of your plate with whole grains. ? Eat low-fat diary products. ? Avoid fatty cuts of meat, processed or cured meats, and poultry with skin. Fill about one quarter of your plate  with lean proteins such as fish, chicken without skin, beans, eggs, and tofu. ? Avoid premade and processed foods. These tend to be higher in sodium, added sugar, and fat.  Reduce your daily sodium intake. Most people with hypertension should eat less than 1,500 mg of sodium a day.  Limit alcohol intake to no more than 1 drink a day for nonpregnant women and 2 drinks a day for men. One drink equals 12 oz of beer, 5 oz of wine, or 1 oz of hard liquor. Lifestyle  Work with your health care provider to maintain a healthy body weight, or to lose weight. Ask what an ideal weight is for you.  Get at least 30 minutes of exercise that causes your heart to beat faster (aerobic exercise)  most days of the week. Activities may include walking, swimming, or biking.  Include exercise to strengthen your muscles (resistance exercise), such as weight lifting, as part of your weekly exercise routine. Try to do these types of exercises for 30 minutes at least 3 days a week.  Do not use any products that contain nicotine or tobacco, such as cigarettes and e-cigarettes. If you need help quitting, ask your health care provider.  Control any long-term (chronic) conditions you have, such as high cholesterol or diabetes. Monitoring  Monitor your blood pressure at home as told by your health care provider. Your personal target blood pressure may vary depending on your medical conditions, your age, and other factors.  Have your blood pressure checked regularly, as often as told by your health care provider. Working with your health care provider  Review all the medicines you take with your health care provider because there may be side effects or interactions.  Talk with your health care provider about your diet, exercise habits, and other lifestyle factors that may be contributing to hypertension.  Visit your health care provider regularly. Your health care provider can help you create and adjust your plan for  managing hypertension. Will I need medicine to control my blood pressure? Your health care provider may prescribe medicine if lifestyle changes are not enough to get your blood pressure under control, and if:  Your systolic blood pressure is 130 or higher.  Your diastolic blood pressure is 80 or higher. Take medicines only as told by your health care provider. Follow the directions carefully. Blood pressure medicines must be taken as prescribed. The medicine does not work as well when you skip doses. Skipping doses also puts you at risk for problems. Contact a health care provider if:  You think you are having a reaction to medicines you have taken.  You have repeated (recurrent) headaches.  You feel dizzy.  You have swelling in your ankles.  You have trouble with your vision. Get help right away if:  You develop a severe headache or confusion.  You have unusual weakness or numbness, or you feel faint.  You have severe pain in your chest or abdomen.  You vomit repeatedly.  You have trouble breathing. Summary  Hypertension is when the force of blood pumping through your arteries is too strong. If this condition is not controlled, it may put you at risk for serious complications.  Your personal target blood pressure may vary depending on your medical conditions, your age, and other factors. For most people, a normal blood pressure is less than 120/80.  Hypertension is managed by lifestyle changes, medicines, or both. Lifestyle changes include weight loss, eating a healthy, low-sodium diet, exercising more, and limiting alcohol. This information is not intended to replace advice given to you by your health care provider. Make sure you discuss any questions you have with your health care provider. Document Revised: 03/03/2019 Document Reviewed: 10/07/2016 Elsevier Patient Education  2020 [employer]

## 2020-10-04 NOTE — Assessment & Plan Note (Signed)
Pap smear UTD. Deferred pelvic exam in the absence of concerns. Encouraged exercise.

## 2020-10-04 NOTE — Assessment & Plan Note (Signed)
Stable, continue adderall 40mg  xl.

## 2020-10-09 ENCOUNTER — Other Ambulatory Visit: Payer: Self-pay | Admitting: Family

## 2020-10-09 DIAGNOSIS — N644 Mastodynia: Secondary | ICD-10-CM

## 2020-10-14 ENCOUNTER — Encounter: Payer: Self-pay | Admitting: Family

## 2020-10-21 ENCOUNTER — Other Ambulatory Visit: Payer: Self-pay

## 2020-10-21 ENCOUNTER — Ambulatory Visit
Admission: RE | Admit: 2020-10-21 | Discharge: 2020-10-21 | Disposition: A | Discharge status: Home / Self Care | Payer: 59 | Source: Ambulatory Visit | Attending: Family | Admitting: Family

## 2020-10-21 DIAGNOSIS — N644 Mastodynia: Secondary | ICD-10-CM | POA: Insufficient documentation

## 2020-12-24 ENCOUNTER — Other Ambulatory Visit: Payer: Self-pay | Admitting: Family

## 2020-12-25 ENCOUNTER — Other Ambulatory Visit: Payer: Self-pay | Admitting: Family

## 2020-12-25 DIAGNOSIS — F902 Attention-deficit hyperactivity disorder, combined type: Secondary | ICD-10-CM

## 2020-12-25 MED ORDER — AMPHETAMINE-DEXTROAMPHET ER 20 MG PO CP24: 40.0000 mg | ORAL_CAPSULE | Freq: Every morning | ORAL | 0 refills | Status: DC

## 2020-12-25 MED ORDER — AMPHETAMINE-DEXTROAMPHET ER 20 MG PO CP24: 40.0000 mg | ORAL_CAPSULE | ORAL | 0 refills | Status: DC

## 2021-01-06 ENCOUNTER — Ambulatory Visit: Payer: 59 | Admitting: Family

## 2021-02-03 ENCOUNTER — Other Ambulatory Visit: Payer: Self-pay

## 2021-02-03 ENCOUNTER — Encounter: Payer: Self-pay | Admitting: Family

## 2021-02-03 ENCOUNTER — Ambulatory Visit: Payer: 59 | Admitting: Family

## 2021-02-03 ENCOUNTER — Other Ambulatory Visit: Payer: Self-pay | Admitting: Family

## 2021-02-03 VITALS — BP 141/102 | HR 103 | Temp 98.0°F | Ht 64.95 in | Wt 195.0 lb

## 2021-02-03 DIAGNOSIS — I10 Essential (primary) hypertension: Secondary | ICD-10-CM

## 2021-02-03 DIAGNOSIS — F902 Attention-deficit hyperactivity disorder, combined type: Secondary | ICD-10-CM | POA: Diagnosis not present

## 2021-02-03 LAB — COMPREHENSIVE METABOLIC PANEL
ALT: 21 U/L (ref 0–35)
AST: 20 U/L (ref 0–37)
Albumin: 4.1 g/dL (ref 3.5–5.2)
Alkaline Phosphatase: 62 U/L (ref 39–117)
BUN: 11 mg/dL (ref 6–23)
CO2: 25 mEq/L (ref 19–32)
Calcium: 9.6 mg/dL (ref 8.4–10.5)
Chloride: 102 mEq/L (ref 96–112)
Creatinine, Ser: 0.7 mg/dL (ref 0.40–1.20)
GFR: 111.98 mL/min (ref >60.00)
Glucose, Bld: 165 mg/dL — ABNORMAL HIGH (ref 70–99)
Potassium: 3.9 mEq/L (ref 3.5–5.1)
Sodium: 136 mEq/L (ref 135–145)
Total Bilirubin: 0.3 mg/dL (ref 0.2–1.2)
Total Protein: 6.8 g/dL (ref 6.0–8.3)

## 2021-02-03 LAB — CBC WITH DIFFERENTIAL/PLATELET
Basophils Absolute: 0.1 10*3/uL (ref 0.0–0.1)
Basophils Relative: 1.1 % (ref 0.0–3.0)
Eosinophils Absolute: 0.2 10*3/uL (ref 0.0–0.7)
Eosinophils Relative: 2.7 % (ref 0.0–5.0)
HCT: 35.7 % — ABNORMAL LOW (ref 36.0–46.0)
Hemoglobin: 11.7 g/dL — ABNORMAL LOW (ref 12.0–15.0)
Lymphocytes Relative: 17.1 % (ref 12.0–46.0)
Lymphs Abs: 1.5 10*3/uL (ref 0.7–4.0)
MCHC: 32.9 g/dL (ref 30.0–36.0)
MCV: 78.3 fl (ref 78.0–100.0)
Monocytes Absolute: 0.5 10*3/uL (ref 0.1–1.0)
Monocytes Relative: 5 % (ref 3.0–12.0)
Neutro Abs: 6.7 10*3/uL (ref 1.4–7.7)
Neutrophils Relative %: 74.1 % (ref 43.0–77.0)
Platelets: 375 10*3/uL (ref 150.0–400.0)
RBC: 4.56 Mil/uL (ref 3.87–5.11)
RDW: 15.8 % — ABNORMAL HIGH (ref 11.5–15.5)
WBC: 9 10*3/uL (ref 4.0–10.5)

## 2021-02-03 LAB — LIPID PANEL
Cholesterol: 170 mg/dL (ref 0–200)
HDL: 42.6 mg/dL (ref >39.00)
NonHDL: 127.48
Total CHOL/HDL Ratio: 4
Triglycerides: 208 mg/dL — ABNORMAL HIGH (ref 0.0–149.0)
VLDL: 41.6 mg/dL — ABNORMAL HIGH (ref 0.0–40.0)

## 2021-02-03 LAB — LDL CHOLESTEROL, DIRECT: Direct LDL: 104 mg/dL

## 2021-02-03 LAB — HEMOGLOBIN A1C: Hgb A1c MFr Bld: 6.1 % (ref 4.6–6.5)

## 2021-02-03 LAB — TSH: TSH: 1.41 u[IU]/mL (ref 0.35–4.50)

## 2021-02-03 LAB — VITAMIN D 25 HYDROXY (VIT D DEFICIENCY, FRACTURES): VITD: 15.84 ng/mL — ABNORMAL LOW (ref 30.00–100.00)

## 2021-02-03 MED ORDER — AMPHETAMINE-DEXTROAMPHETAMINE 5 MG PO TABS: 5.0000 mg | ORAL_TABLET | Freq: Every day | ORAL | 0 refills | Status: DC | PRN

## 2021-02-03 MED ORDER — METOPROLOL SUCCINATE ER 50 MG PO TB24: 50.0000 mg | ORAL_TABLET | Freq: Every day | ORAL | 1 refills | Status: DC

## 2021-02-03 NOTE — Patient Instructions (Signed)
Increase toprol to 50mg  daily Goal of blood pressure less than 120/80  Adderall 5mg  to use in the afternoon if needed for breakthrough symptoms.

## 2021-02-03 NOTE — Progress Notes (Signed)
Subjective:    Patient ID: Megan Tucker, female    DOB: [birth year], 35 y.o.   MRN: [id number]  CC: Megan Tucker is a 35 y.o. female who presents today for follow up.   HPI:  ADHD - compliant with adderall 40mg  which has always been helpful. She only uses at work and has noticed that mid afternoon effects starts to wane. She is interested in short acting as needed adderall. No increased anxiety.   HTN- compliant with toprol 25mg . She has taken 50mg  qd one weekend when blood pressure elevated at work. She stopped taking hctz due to frequent urination. At home 120/88.  No cp, sob, ha.   No decongestants, NSAIDs.     HISTORY:  Past Medical History:  Diagnosis Date  . Anemia   . Anemia   . Bacterial vaginitis   . Gestational diabetes   . Hypertension    Past Surgical History:  Procedure Laterality Date  . MOUTH SURGERY    . TUBAL LIGATION  2014   Family History  Problem Relation Age of Onset  . Drug abuse Mother        narcotic addition  . Stroke Mother   . Hypertension Mother   . Mental illness Mother   . Diabetes Mother   . Diabetes Father   . Hypertension Maternal Grandmother   . Diabetes Maternal Grandmother   . Diabetes Maternal Grandfather   . Diabetes Paternal Grandmother   . Diabetes Paternal Grandfather   . Other Neg Hx     Allergies: Amlodipine and Adhesive [tape] Current Outpatient Medications on File Prior to Visit  Medication Sig Dispense Refill  . amphetamine-dextroamphetamine (ADDERALL XR) 20 MG 24 hr capsule Take 2 capsules (40 mg total) by mouth every morning. 60 capsule 0  . amphetamine-dextroamphetamine (ADDERALL XR) 20 MG 24 hr capsule Take 2 capsules (40 mg total) by mouth every morning. 60 capsule 0  . amphetamine-dextroamphetamine (ADDERALL XR) 20 MG 24 hr capsule Take 2 capsules (40 mg total) by mouth every morning. 60 capsule 0  . hydrochlorothiazide (MICROZIDE) 12.5 MG capsule Take 1 capsule (12.5 mg total) by mouth daily. (Patient not  taking: Reported on 02/03/2021) 90 capsule 3   No current facility-administered medications on file prior to visit.    Social History   Tobacco Use  . Smoking status: Former  . Smokeless tobacco: Never Used  Vaping Use  . Vaping Use: Never used  Substance Use Topics  . Alcohol use: Not Currently    Alcohol/week: 0.0 - 1.0 standard drinks    Comment: glass of wine socially; once per month  . Drug use: No    Review of Systems  Constitutional: Negative for chills and fever.  Respiratory: Negative for cough.   Cardiovascular: Negative for chest pain and palpitations.  Gastrointestinal: Negative for nausea and vomiting.  Neurological: Negative for dizziness and headaches.      Objective:    BP (!) 141/102   Pulse (!) 103   Temp 98 F (36.7 C)   Ht 5' 4.95" (1.65 m)   Wt 195 lb (88.5 kg)   SpO2 99%   BMI 32.50 kg/m  BP Readings from Last 3 Encounters:  02/03/21 (!) 141/102  10/04/20 (!) 150/100  06/04/20 123/89   Wt Readings from Last 3 Encounters:  02/03/21 195 lb (88.5 kg)  10/04/20 195 lb 4.8 oz (88.6 kg)  06/04/20 195 lb (88.5 kg)    Physical Exam Vitals reviewed.  Constitutional:  Appearance: She is well-developed.  Eyes:     Conjunctiva/sclera: Conjunctivae normal.  Cardiovascular:     Rate and Rhythm: Normal rate and regular rhythm.     Pulses: Normal pulses.     Heart sounds: Normal heart sounds.  Pulmonary:     Effort: Pulmonary effort is normal.     Breath sounds: Normal breath sounds. No wheezing, rhonchi or rales.  Skin:    General: Skin is warm and dry.  Neurological:     Mental Status: She is alert.  Psychiatric:        Speech: Speech normal.        Behavior: Behavior normal.        Thought Content: Thought content normal.        Assessment & Plan:   Problem List Items Addressed This Visit      Cardiovascular and Mediastinum   HTN (hypertension)    Uncontrolled. Increase toprol to 50mg . Goal < 120/80. Close follow up.        Relevant Medications   metoprolol succinate (TOPROL-XL) 50 MG 24 hr tablet   Other Relevant Orders   TSH   CBC with Differential/Platelet   Comprehensive metabolic panel   Hemoglobin A1c   Lipid panel   VITAMIN D 25 Hydroxy (Vit-D Deficiency, Fractures)     Other   Attention deficit hyperactivity disorder (ADHD) - Primary    Breakthrough symptoms in the afternoon. Continue adderall XL 40mg  and start adderall 5mg  QD prn in the afternoon. I looked up patient on [place] Controlled Substances Reporting System PMP AWARE and saw no activity that raised concern of inappropriate use.        Relevant Medications   amphetamine-dextroamphetamine (ADDERALL) 5 MG tablet       I have discontinued "Kynsleigh Spake"'s metoprolol succinate. I am also having her start on metoprolol succinate and amphetamine-dextroamphetamine. Additionally, I am having her maintain her hydrochlorothiazide, amphetamine-dextroamphetamine, amphetamine-dextroamphetamine, and amphetamine-dextroamphetamine.   Meds ordered this encounter  Medications  . metoprolol succinate (TOPROL-XL) 50 MG 24 hr tablet    Sig: Take 1 tablet (50 mg total) by mouth daily. Take with or immediately following a meal.    Dispense:  90 tablet    Refill:  1    Order Specific Question:   Supervising Provider    Answer:   L [[id number]]  . amphetamine-dextroamphetamine (ADDERALL) 5 MG tablet    Sig: Take 1 tablet (5 mg total) by mouth daily as needed (in the afternoon for breakthrough symptoms).    Dispense:  30 tablet    Refill:  0    Order Specific Question:   Supervising Provider    Answer:   [[id number]]    Return precautions given.   Risks, benefits, and alternatives of the medications and treatment plan prescribed today were discussed, and patient expressed understanding.   Education regarding symptom management and diagnosis given to patient on AVS.  Continue to follow with Tyrone Nine, FNP  for routine health maintenance.   Duncan Dull and I agreed with plan.   Sherlene Shams, FNP

## 2021-02-03 NOTE — Assessment & Plan Note (Addendum)
Breakthrough symptoms in the afternoon. Continue adderall XL 40mg  and start adderall 5mg  QD prn in the afternoon. I looked up patient on [place] Controlled Substances Reporting System PMP AWARE and saw no activity that raised concern of inappropriate use.

## 2021-02-03 NOTE — Assessment & Plan Note (Signed)
Uncontrolled. Increase toprol to 50mg . Goal < 120/80. Close follow up.

## 2021-02-04 ENCOUNTER — Telehealth: Payer: Self-pay | Admitting: Family

## 2021-02-04 ENCOUNTER — Other Ambulatory Visit (INDEPENDENT_AMBULATORY_CARE_PROVIDER_SITE_OTHER): Payer: 59

## 2021-02-04 DIAGNOSIS — D649 Anemia, unspecified: Secondary | ICD-10-CM | POA: Diagnosis not present

## 2021-02-04 LAB — IBC + FERRITIN
Ferritin: 6.7 ng/mL — ABNORMAL LOW (ref 10.0–291.0)
Iron: 25 ug/dL — ABNORMAL LOW (ref 42–145)
Saturation Ratios: 6.2 % — ABNORMAL LOW (ref 20.0–50.0)
Transferrin: 288 mg/dL (ref 212.0–360.0)

## 2021-02-04 LAB — B12 AND FOLATE PANEL
Folate: 8.5 ng/mL (ref >5.9)
Vitamin B-12: 171 pg/mL — ABNORMAL LOW (ref 211–911)

## 2021-02-04 NOTE — Telephone Encounter (Signed)
Can I add on ferritin and b12 studies to labs yesterday?

## 2021-02-04 NOTE — Telephone Encounter (Signed)
Add on sheet faxed 

## 2021-02-05 ENCOUNTER — Other Ambulatory Visit: Payer: Self-pay | Admitting: Family

## 2021-02-05 DIAGNOSIS — E538 Deficiency of other specified B group vitamins: Secondary | ICD-10-CM

## 2021-02-17 ENCOUNTER — Other Ambulatory Visit: Payer: 59

## 2021-03-05 ENCOUNTER — Ambulatory Visit: Payer: 59 | Admitting: Family

## 2021-03-20 ENCOUNTER — Other Ambulatory Visit: Payer: Self-pay

## 2021-03-20 MED ORDER — AMPHETAMINE-DEXTROAMPHET ER 20 MG PO CP24
ORAL_CAPSULE | ORAL | 0 refills | Status: DC
  Filled 2021-03-20: qty 60, 30d supply, fill #0

## 2021-03-20 MED ORDER — AMPHETAMINE-DEXTROAMPHET ER 20 MG PO CP24
ORAL_CAPSULE | ORAL | 0 refills | Status: DC
  Filled 2021-05-02: qty 60, 30d supply, fill #0

## 2021-03-21 ENCOUNTER — Other Ambulatory Visit: Payer: Self-pay

## 2021-04-01 ENCOUNTER — Other Ambulatory Visit: Payer: Self-pay

## 2021-04-01 ENCOUNTER — Encounter: Payer: Self-pay | Admitting: Family

## 2021-04-01 ENCOUNTER — Ambulatory Visit (INDEPENDENT_AMBULATORY_CARE_PROVIDER_SITE_OTHER): Payer: 59 | Admitting: Family

## 2021-04-01 VITALS — BP 118/72 | HR 93 | Temp 98.5°F | Ht 64.95 in | Wt 194.0 lb

## 2021-04-01 DIAGNOSIS — E559 Vitamin D deficiency, unspecified: Secondary | ICD-10-CM

## 2021-04-01 DIAGNOSIS — F902 Attention-deficit hyperactivity disorder, combined type: Secondary | ICD-10-CM

## 2021-04-01 DIAGNOSIS — N920 Excessive and frequent menstruation with regular cycle: Secondary | ICD-10-CM | POA: Diagnosis not present

## 2021-04-01 DIAGNOSIS — E538 Deficiency of other specified B group vitamins: Secondary | ICD-10-CM

## 2021-04-01 DIAGNOSIS — R5383 Other fatigue: Secondary | ICD-10-CM | POA: Diagnosis not present

## 2021-04-01 DIAGNOSIS — I10 Essential (primary) hypertension: Secondary | ICD-10-CM

## 2021-04-01 LAB — LIPID PANEL
Cholesterol: 176 mg/dL (ref 0–200)
HDL: 42.9 mg/dL (ref >39.00)
LDL Cholesterol: 108 mg/dL — ABNORMAL HIGH (ref 0–99)
NonHDL: 133.32
Total CHOL/HDL Ratio: 4
Triglycerides: 129 mg/dL (ref 0.0–149.0)
VLDL: 25.8 mg/dL (ref 0.0–40.0)

## 2021-04-01 LAB — URINALYSIS, ROUTINE W REFLEX MICROSCOPIC
Bilirubin Urine: NEGATIVE
Hgb urine dipstick: NEGATIVE
Ketones, ur: NEGATIVE
Leukocytes,Ua: NEGATIVE
Nitrite: NEGATIVE
Specific Gravity, Urine: 1.025 (ref 1.000–1.030)
Total Protein, Urine: NEGATIVE
Urine Glucose: NEGATIVE
Urobilinogen, UA: 0.2 (ref 0.0–1.0)
pH: 6 (ref 5.0–8.0)

## 2021-04-01 MED ORDER — CYANOCOBALAMIN 1000 MCG/ML IJ SOLN
INTRAMUSCULAR | 15 refills | Status: DC
  Filled 2021-04-01: qty 6, 84d supply, fill #0

## 2021-04-01 MED ORDER — FERROUS SULFATE 325 (65 FE) MG PO TABS
325.0000 mg | ORAL_TABLET | Freq: Every day | ORAL | 2 refills | Status: DC
  Filled 2021-04-01: qty 30, 30d supply, fill #0

## 2021-04-01 MED ORDER — CHOLECALCIFEROL 1.25 MG (50000 UT) PO CAPS
ORAL_CAPSULE | ORAL | 0 refills | Status: DC
  Filled 2021-04-01: qty 8, 56d supply, fill #0

## 2021-04-01 NOTE — Progress Notes (Signed)
Subjective:    Patient ID: Megan Tucker, female    DOB: [birth year], 35 y.o.   MRN: [id number]  CC: Megan Tucker is a 35 y.o. female who presents today for follow up.   HPI: Feels well today No new complaints  Menorrhagia improved overall. She on menses today. She is following with Dr Valentino Saxon. Menses last 5-6 days and not as heavy as had been in the past.  She is not on birth control.  Endorses pelvic cramping couple of days prior to and aftermenses which resolves with heat She had discussed hysterectomy with Dr Valentino Saxon and postponed due to pandemic and life circumstances  She does feel more fatigued recently and not sure if related to vitamin deficiencies. She states that she is not depressed. Sleeping well.   She is not taking iron nor vitamin D.   No blood in stool or urine.    She has nurse in family and would prefer B12 im to be given at home.   HTN- increased toprol to 50mg  and blood pressure < 120/80. Shr is not longer on HCTZ.  No cp, sob, dizziness.   ADHD- Compliant with adderall XL 40mg  and she has start adderall 5mg  QD prn in the afternoon. No trouble sleeping or increased anxiety.    HISTORY:  Past Medical History:  Diagnosis Date  . Anemia   . Anemia   . Bacterial vaginitis   . Gestational diabetes   . Hypertension    Past Surgical History:  Procedure Laterality Date  . MOUTH SURGERY    . TUBAL LIGATION  2014   Family History  Problem Relation Age of Onset  . Drug abuse Mother        narcotic addition  . Stroke Mother   . Hypertension Mother   . Mental illness Mother   . Diabetes Mother   . Diabetes Father   . Hypertension Maternal Grandmother   . Diabetes Maternal Grandmother   . Diabetes Maternal Grandfather   . Diabetes Paternal Grandmother   . Diabetes Paternal Grandfather   . Other Neg Hx     Allergies: Amlodipine and Adhesive [tape] Current Outpatient Medications on File Prior to Visit  Medication Sig Dispense Refill  .  amphetamine-dextroamphetamine (ADDERALL XR) 20 MG 24 hr capsule Take 2 capsules (40 mg total) by mouth every morning. 60 capsule 0  . amphetamine-dextroamphetamine (ADDERALL XR) 20 MG 24 hr capsule TAKE 2 CAPSULES (40 MG) BY MOUTH EVERY MORNING. 60 capsule 0  . amphetamine-dextroamphetamine (ADDERALL XR) 20 MG 24 hr capsule TAKE 2 CAPSULES (40 MG) BY MOUTH EVERY MORNING. FILL ON/AFTER 01/22/21. 60 capsule 0  . amphetamine-dextroamphetamine (ADDERALL XR) 20 MG 24 hr capsule take 2 capsules by mouth daily every morning 60 capsule 0  . amphetamine-dextroamphetamine (ADDERALL XR) 20 MG 24 hr capsule take 2 capsules bymouth daily every morning  fill 02/22/21 60 capsule 0  . amphetamine-dextroamphetamine (ADDERALL) 5 MG tablet TAKE 1 TABLET BY MOUTH DAILY AS NEEDED (IN THE AFTERNOON FOR BREAKTHROUGH SYMPTOMS). 30 tablet 0  . metoprolol succinate (TOPROL-XL) 50 MG 24 hr tablet TAKE 1 TABLET BY MOUTH DAILY. TAKE WITH OR IMMEDIATELY FOLLOWING A MEAL. (Patient taking differently: Take by mouth daily. TAKE WITH OR IMMEDIATELY FOLLOWING A MEAL.) 90 tablet 1   No current facility-administered medications on file prior to visit.    Social History   Tobacco Use  . Smoking status: Former  . Smokeless tobacco: Never Used  Vaping Use  . Vaping Use: Never  used  Substance Use Topics  . Alcohol use: Not Currently    Alcohol/week: 0.0 - 1.0 standard drinks    Comment: glass of wine socially; once per month  . Drug use: No    Review of Systems  Constitutional: Positive for fatigue. Negative for chills and fever.  Respiratory: Negative for cough.   Cardiovascular: Negative for chest pain and palpitations.  Gastrointestinal: Negative for nausea and vomiting.  Genitourinary: Positive for menstrual problem and pelvic pain.      Objective:    BP 118/72 (BP Location: Left Arm, Patient Position: Sitting, Cuff Size: Large)   Pulse 93   Temp 98.5 F (36.9 C) (Oral)   Ht 5' 4.95" (1.65 m)   Wt 194 lb (88  kg)   SpO2 97%   BMI 32.33 kg/m  BP Readings from Last 3 Encounters:  04/01/21 118/72  02/03/21 (!) 141/102  10/04/20 (!) 150/100   Wt Readings from Last 3 Encounters:  04/01/21 194 lb (88 kg)  02/03/21 195 lb (88.5 kg)  10/04/20 195 lb 4.8 oz (88.6 kg)    Physical Exam Vitals reviewed.  Constitutional:      Appearance: She is well-developed.  Eyes:     Conjunctiva/sclera: Conjunctivae normal.  Cardiovascular:     Rate and Rhythm: Normal rate and regular rhythm.     Pulses: Normal pulses.     Heart sounds: Normal heart sounds.  Pulmonary:     Effort: Pulmonary effort is normal.     Breath sounds: Normal breath sounds. No wheezing, rhonchi or rales.  Skin:    General: Skin is warm and dry.  Neurological:     Mental Status: She is alert.  Psychiatric:        Speech: Speech normal.        Behavior: Behavior normal.        Thought Content: Thought content normal.        Assessment & Plan:   Problem List Items Addressed This Visit      Cardiovascular and Mediastinum   HTN (hypertension)    Excellent control. Continue toprol 50mg  qd        Other   Attention deficit hyperactivity disorder (ADHD)    Controlled. Continue adderall Xr 40mg  and adderall 5mg  prn qd       B12 deficiency    Start b12 IM. Pending lab evaluation today.       Relevant Medications   cyanocobalamin (,VITAMIN B-12,) 1000 MCG/ML injection   Fatigue    Etiology nonspecific however we discussed likely vitamin D deficiency , b12 deficiency and iron deficiency likely contributory. We are working to replenish and if no improvement or new symptoms, patient will let me kniw.       Menorrhagia with regular cycle - Primary    Improved. Labs consistent with IDA. Pending b12 lab studies. She will start ferrous sulfate and we had a long discussion in regards to return to follow up with Dr as she would likely continue to have anemia in setting of heavy periods. Pending occult stool cards, UA.        Relevant Medications   ferrous sulfate 325 (65 FE) MG tablet   cyanocobalamin (,VITAMIN B-12,) 1000 MCG/ML injection   Other Relevant Orders   Urinalysis, Routine w reflex microscopic (Completed)   Lipid panel (Completed)   Fecal occult blood, imunochemical    Other Visit Diagnoses    Vitamin D deficiency       Relevant Medications   Cholecalciferol  1.25 MG (50000 UT) capsule   Low serum vitamin B12           I have discontinued Cerissa M. Tramble's hydrochlorothiazide. I am also having her start on Cholecalciferol, ferrous sulfate, and cyanocobalamin. Additionally, I am having her maintain her amphetamine-dextroamphetamine, metoprolol succinate, amphetamine-dextroamphetamine, amphetamine-dextroamphetamine, amphetamine-dextroamphetamine, amphetamine-dextroamphetamine, and amphetamine-dextroamphetamine.   Meds ordered this encounter  Medications  . Cholecalciferol 1.25 MG (50000 UT) capsule    Sig: Take 1 capsule by mouth once weekly for 8 weeks    Dispense:  8 capsule    Refill:  0    Order Specific Question:   Supervising Provider    Answer:   Duncan Dull L [[id number]]  . ferrous sulfate 325 (65 FE) MG tablet    Sig: Take 1 tablet (325 mg total) by mouth daily with breakfast.    Dispense:  30 tablet    Refill:  2    Order Specific Question:   Supervising Provider    Answer:   Duncan Dull L [[id number]]  . cyanocobalamin (,VITAMIN B-12,) 1000 MCG/ML injection    Sig: 1000 mcg (1 mL) intramuscular injection in the thigh ( vastus lateralis) once per week for four weeks, followed by 1000 mcg IM injection once per month.    Dispense:  1 mL    Refill:  15    Order Specific Question:   Supervising Provider    Answer:   Sherlene Shams [[id number]]    Return precautions given.   Risks, benefits, and alternatives of the medications and treatment plan prescribed today were discussed, and patient expressed understanding.   Education regarding symptom management and diagnosis given to patient  on AVS.  Continue to follow with Allegra Grana, FNP for routine health maintenance.   Tyrone Nine and I agreed with plan.   Rennie Plowman, FNP

## 2021-04-01 NOTE — Assessment & Plan Note (Signed)
Start b12 IM. Pending lab evaluation today.

## 2021-04-01 NOTE — Patient Instructions (Signed)
Start b12, iron  Return stool cards  Please let me know if fatigue doesn't improve

## 2021-04-02 DIAGNOSIS — R5383 Other fatigue: Secondary | ICD-10-CM | POA: Insufficient documentation

## 2021-04-02 NOTE — Assessment & Plan Note (Signed)
Etiology nonspecific however we discussed likely vitamin D deficiency , b12 deficiency and iron deficiency likely contributory. We are working to replenish and if no improvement or new symptoms, patient will let me kniw.

## 2021-04-02 NOTE — Assessment & Plan Note (Addendum)
Improved. Labs consistent with IDA. Pending b12 lab studies. She will start ferrous sulfate and we had a long discussion in regards to return to follow up with Dr Valentino Saxon as she would likely continue to have anemia in setting of heavy periods. Pending occult stool cards, UA.

## 2021-04-02 NOTE — Assessment & Plan Note (Signed)
Controlled. Continue adderall Xr 40mg  and adderall 5mg  prn qd

## 2021-04-02 NOTE — Assessment & Plan Note (Signed)
Excellent control. Continue toprol 50mg  qd

## 2021-04-03 LAB — CELIAC DISEASE AB SCREEN W/RFX
Antigliadin Abs, IgA: 4 units (ref 0–19)
IgA/Immunoglobulin A, Serum: 125 mg/dL (ref 87–352)
Transglutaminase IgA: <2 U/mL (ref 0–3)

## 2021-04-04 LAB — METHYLMALONIC ACID, SERUM: Methylmalonic Acid, Quant: 337 nmol/L — ABNORMAL HIGH (ref 87–318)

## 2021-04-04 LAB — ANTI-PARIETAL ANTIBODY: PARIETAL CELL AB SCREEN: NEGATIVE

## 2021-04-04 LAB — HOMOCYSTEINE: Homocysteine: 14.1 umol/L — ABNORMAL HIGH (ref <10.4)

## 2021-04-04 LAB — INTRINSIC FACTOR ANTIBODIES: Intrinsic Factor: NEGATIVE

## 2021-05-02 ENCOUNTER — Other Ambulatory Visit: Payer: Self-pay

## 2021-05-02 ENCOUNTER — Other Ambulatory Visit: Payer: Self-pay | Admitting: Family

## 2021-05-02 DIAGNOSIS — F902 Attention-deficit hyperactivity disorder, combined type: Secondary | ICD-10-CM

## 2021-05-05 ENCOUNTER — Other Ambulatory Visit: Payer: Self-pay

## 2021-05-06 ENCOUNTER — Other Ambulatory Visit: Payer: Self-pay

## 2021-05-06 MED FILL — Amphetamine-Dextroamphetamine Tab 5 MG: ORAL | 30 days supply | Qty: 30 | Fill #0 | Status: CN

## 2021-05-20 ENCOUNTER — Other Ambulatory Visit: Payer: Self-pay

## 2021-07-04 ENCOUNTER — Encounter: Payer: Self-pay | Admitting: Family

## 2021-07-07 ENCOUNTER — Ambulatory Visit: Payer: 59 | Admitting: Family

## 2021-07-09 ENCOUNTER — Other Ambulatory Visit: Payer: Self-pay | Admitting: Family

## 2021-07-09 ENCOUNTER — Other Ambulatory Visit: Payer: Self-pay

## 2021-07-09 DIAGNOSIS — F419 Anxiety disorder, unspecified: Secondary | ICD-10-CM

## 2021-07-09 MED ORDER — BUSPIRONE HCL 7.5 MG PO TABS
7.5000 mg | ORAL_TABLET | Freq: Two times a day (BID) | ORAL | 2 refills | Status: DC | PRN
  Filled 2021-07-09: qty 60, 30d supply, fill #0

## 2021-07-09 NOTE — Telephone Encounter (Signed)
Please advise 

## 2021-07-11 ENCOUNTER — Other Ambulatory Visit: Payer: Self-pay | Admitting: Family

## 2021-07-11 ENCOUNTER — Other Ambulatory Visit: Payer: Self-pay

## 2021-07-11 DIAGNOSIS — F419 Anxiety disorder, unspecified: Secondary | ICD-10-CM

## 2021-07-11 MED ORDER — HYDROXYZINE HCL 10 MG PO TABS
10.0000 mg | ORAL_TABLET | Freq: Two times a day (BID) | ORAL | 0 refills | Status: DC | PRN
  Filled 2021-07-11: qty 90, 45d supply, fill #0

## 2021-07-17 ENCOUNTER — Other Ambulatory Visit: Payer: Self-pay

## 2021-07-17 ENCOUNTER — Telehealth: Payer: Self-pay | Admitting: Family

## 2021-07-17 MED ORDER — AMPHETAMINE-DEXTROAMPHET ER 20 MG PO CP24
40.0000 mg | ORAL_CAPSULE | ORAL | 0 refills | Status: DC
  Filled 2021-07-17: qty 60, 30d supply, fill #0

## 2021-07-17 MED FILL — Amphetamine-Dextroamphetamine Tab 5 MG: ORAL | 30 days supply | Qty: 30 | Fill #0 | Status: AC

## 2021-07-17 NOTE — Telephone Encounter (Signed)
Spoke with pt and she stated that she only has two days worth left. Pt stated that only takes while at work and she plans to go back to work this weekend.   Refilled: 12/25/2020 Last OV: 04/01/2021 Next OV: not scheduled

## 2021-07-17 NOTE — Telephone Encounter (Signed)
Refill sent to pharmacy.  She needs to be scheduled for follow-up with her PCP for further refills.

## 2021-07-18 ENCOUNTER — Other Ambulatory Visit: Payer: Self-pay

## 2021-07-22 NOTE — Telephone Encounter (Signed)
LMTCB. Need to schedule pt for a follow up with Claris Che, NP for further refills.

## 2021-07-25 NOTE — Telephone Encounter (Signed)
Scheduled pt for F/U appt in October.Pts niece is actively dying from cancer so pt will call us to reschedule if date is not okay.

## 2021-07-25 NOTE — Telephone Encounter (Signed)
Patient is returning your call from Tuesday.

## 2021-09-03 ENCOUNTER — Ambulatory Visit (INDEPENDENT_AMBULATORY_CARE_PROVIDER_SITE_OTHER): Payer: 59 | Admitting: Family

## 2021-09-03 ENCOUNTER — Other Ambulatory Visit: Payer: Self-pay

## 2021-09-03 ENCOUNTER — Encounter: Payer: Self-pay | Admitting: Family

## 2021-09-03 VITALS — BP 136/90 | HR 109 | Temp 98.1°F | Ht 64.95 in | Wt 199.2 lb

## 2021-09-03 DIAGNOSIS — I10 Essential (primary) hypertension: Secondary | ICD-10-CM

## 2021-09-03 DIAGNOSIS — F902 Attention-deficit hyperactivity disorder, combined type: Secondary | ICD-10-CM | POA: Diagnosis not present

## 2021-09-03 DIAGNOSIS — F4321 Adjustment disorder with depressed mood: Secondary | ICD-10-CM

## 2021-09-03 NOTE — Assessment & Plan Note (Signed)
Overall pleased blood pressure readings agreed the patient will continue to monitor blood pressure and call me with any concerns, escalations.  Continue Toprol 50 XL mg.

## 2021-09-03 NOTE — Assessment & Plan Note (Signed)
Grief reaction after passing of young niece. We agreed consult with counselor for grief counselor.

## 2021-09-03 NOTE — Progress Notes (Signed)
Subjective:    Patient ID: Megan Tucker, female    DOB: 1986/04/25, 35 y.o.   MRN: [id number]  CC: Megan Tucker is a 35 y.o. female who presents today for follow up.   HPI: Niece ( 5 yrs) passed away 2 months ago. She has felt she may be suppressing grief over this painful loss as she has been strong for her family. Interested in counseling.    ADHD-compliant with Adderall XR 40 mg; she would take additional tablet of Adderall IR 5 mg in the afternoon as needed. She only uses adderall during work only. Regimen remains very effective for concentration.   Anxiety-she is no longer using atarax as anxiety has resolved.   HTN-compliant with metoprolol 50 mg. No cp, sob  BP at home 120-130/ 80.    HISTORY:  Past Medical History:  Diagnosis Date   Anemia    Anemia    Bacterial vaginitis    Gestational diabetes    Hypertension    Past Surgical History:  Procedure Laterality Date   MOUTH SURGERY     TUBAL LIGATION  2014   Family History  Problem Relation Age of Onset   Drug abuse Mother        narcotic addition   Stroke Mother    Hypertension Mother    Mental illness Mother    Diabetes Mother    Diabetes Father    Hypertension Maternal Grandmother    Diabetes Maternal Grandmother    Diabetes Maternal Grandfather    Diabetes Paternal Grandmother    Diabetes Paternal Grandfather    Other Neg Hx     Allergies: Amlodipine and Adhesive [tape] Current Outpatient Medications on File Prior to Visit  Medication Sig Dispense Refill   amphetamine-dextroamphetamine (ADDERALL XR) 20 MG 24 hr capsule Take 2 capsules (40 mg total) by mouth every morning. 60 capsule 0   amphetamine-dextroamphetamine (ADDERALL XR) 20 MG 24 hr capsule take 2 capsules by mouth daily every morning 60 capsule 0   amphetamine-dextroamphetamine (ADDERALL XR) 20 MG 24 hr capsule Take 2 capsules (40 mg total) by mouth every morning. 60 capsule 0   amphetamine-dextroamphetamine (ADDERALL) 5 MG tablet TAKE 1  TABLET BY MOUTH DAILY AS NEEDED (IN THE AFTERNOON FOR BREAKTHROUGH SYMPTOMS). 30 tablet 0   metoprolol succinate (TOPROL-XL) 50 MG 24 hr tablet TAKE 1 TABLET BY MOUTH DAILY. TAKE WITH OR IMMEDIATELY FOLLOWING A MEAL. 90 tablet 1   amphetamine-dextroamphetamine (ADDERALL XR) 20 MG 24 hr capsule TAKE 2 CAPSULES (40 MG) BY MOUTH EVERY MORNING. 60 capsule 0   amphetamine-dextroamphetamine (ADDERALL XR) 20 MG 24 hr capsule TAKE 2 CAPSULES (40 MG) BY MOUTH EVERY MORNING. FILL ON/AFTER 01/22/21. 60 capsule 0   cyanocobalamin (,VITAMIN B-12,) 1000 MCG/ML injection 1000 mcg (1 mL) intramuscular injection in the thigh ( vastus lateralis) once per week for four weeks, followed by 1000 mcg IM injection once per month. (Patient not taking: Reported on 09/03/2021) 1 mL 15   ferrous sulfate 325 (65 FE) MG tablet Take 1 tablet (325 mg total) by mouth daily with breakfast. (Patient not taking: Reported on 09/03/2021) 30 tablet 2   No current facility-administered medications on file prior to visit.    Social History   Tobacco Use   Smoking status: Former   Smokeless tobacco: Never  Building services engineer Use: Never used  Substance Use Topics   Alcohol use: Not Currently    Alcohol/week: 0.0 - 1.0 standard drinks    Comment: glass  of wine socially; once per month   Drug use: No    Review of Systems  Constitutional:  Negative for chills and fever.  Respiratory:  Negative for cough.   Cardiovascular:  Negative for chest pain and palpitations.  Gastrointestinal:  Negative for nausea and vomiting.  Psychiatric/Behavioral:  Negative for sleep disturbance. The patient is not nervous/anxious (resolved).      Objective:    BP 136/90 (BP Location: Left Arm, Patient Position: Sitting, Cuff Size: Large)   Pulse (!) 109   Temp 98.1 F (36.7 C) (Oral)   Ht 5' 4.95" (1.65 m)   Wt 199 lb 3.2 oz (90.4 kg)   SpO2 98%   BMI 33.20 kg/m  BP Readings from Last 3 Encounters:  09/03/21 136/90  04/01/21 118/72   02/03/21 (!) 141/102   Wt Readings from Last 3 Encounters:  09/03/21 199 lb 3.2 oz (90.4 kg)  04/01/21 194 lb (88 kg)  02/03/21 195 lb (88.5 kg)    Physical Exam Vitals reviewed.  Constitutional:      Appearance: She is well-developed.  Eyes:     Conjunctiva/sclera: Conjunctivae normal.  Cardiovascular:     Rate and Rhythm: Normal rate and regular rhythm.     Pulses: Normal pulses.     Heart sounds: Normal heart sounds.  Pulmonary:     Effort: Pulmonary effort is normal.     Breath sounds: Normal breath sounds. No wheezing, rhonchi or rales.  Skin:    General: Skin is warm and dry.  Neurological:     Mental Status: She is alert.  Psychiatric:        Speech: Speech normal.        Behavior: Behavior normal.        Thought Content: Thought content normal.       Assessment & Plan:   Problem List Items Addressed This Visit       Cardiovascular and Mediastinum   HTN (hypertension) - Primary    Overall pleased blood pressure readings agreed the patient will continue to monitor blood pressure and call me with any concerns, escalations.  Continue Toprol 50 XL mg.        Other   Attention deficit hyperactivity disorder (ADHD)    Chronic, stable.  Continue Adderall XR 40 mg on work days; Adderall IR 5 mg in the afternoon as needed.      Grief reaction    Grief reaction after passing of young niece. We agreed consult with counselor for grief counselor.       Relevant Orders   Ambulatory referral to Psychology     I have discontinued Elner M. Mangino's Cholecalciferol and hydrOXYzine. I am also having her maintain her amphetamine-dextroamphetamine, metoprolol succinate, amphetamine-dextroamphetamine, amphetamine-dextroamphetamine, amphetamine-dextroamphetamine, ferrous sulfate, cyanocobalamin, amphetamine-dextroamphetamine, and amphetamine-dextroamphetamine.   No orders of the defined types were placed in this encounter.   Return precautions given.   Risks,  benefits, and alternatives of the medications and treatment plan prescribed today were discussed, and patient expressed understanding.   Education regarding symptom management and diagnosis given to patient on AVS.  Continue to follow with Allegra Grana, FNP for routine health maintenance.   Tyrone Nine and I agreed with plan.   Rennie Plowman, FNP

## 2021-09-03 NOTE — Assessment & Plan Note (Signed)
Chronic, stable.  Continue Adderall XR 40 mg on work days; Adderall IR 5 mg in the afternoon as needed.

## 2021-09-05 ENCOUNTER — Other Ambulatory Visit: Payer: Self-pay | Admitting: Family Medicine

## 2021-09-05 ENCOUNTER — Other Ambulatory Visit: Payer: Self-pay

## 2021-09-08 ENCOUNTER — Other Ambulatory Visit: Payer: Self-pay

## 2021-09-08 ENCOUNTER — Other Ambulatory Visit: Payer: Self-pay | Admitting: Family

## 2021-09-08 DIAGNOSIS — F902 Attention-deficit hyperactivity disorder, combined type: Secondary | ICD-10-CM

## 2021-09-08 MED ORDER — AMPHETAMINE-DEXTROAMPHET ER 20 MG PO CP24
ORAL_CAPSULE | ORAL | 0 refills | Status: DC
Start: 2021-09-08 — End: 2022-03-04
  Filled 2021-09-08: qty 60, fill #0
  Filled 2021-09-08: qty 60, 30d supply, fill #0

## 2021-09-08 MED ORDER — AMPHETAMINE-DEXTROAMPHET ER 20 MG PO CP24
40.0000 mg | ORAL_CAPSULE | Freq: Every morning | ORAL | 0 refills | Status: DC
Start: 2021-09-08 — End: 2022-03-04
  Filled 2021-09-08 – 2021-11-21 (×2): qty 60, 30d supply, fill #0

## 2021-09-08 MED ORDER — AMPHETAMINE-DEXTROAMPHET ER 20 MG PO CP24
40.0000 mg | ORAL_CAPSULE | ORAL | 0 refills | Status: DC
Start: 2021-09-08 — End: 2022-01-30
  Filled 2021-09-08 – 2021-12-31 (×2): qty 60, 30d supply, fill #0

## 2021-10-02 ENCOUNTER — Ambulatory Visit: Payer: 59 | Admitting: Psychology

## 2021-10-09 ENCOUNTER — Ambulatory Visit: Payer: 59 | Admitting: Psychology

## 2021-10-28 ENCOUNTER — Ambulatory Visit: Payer: 59 | Admitting: Psychology

## 2021-11-11 ENCOUNTER — Ambulatory Visit: Payer: 59 | Admitting: Psychology

## 2021-11-17 IMAGING — US US BREAST*L* LIMITED INC AXILLA
1 series · 8 of 8 positions shown · non-contrast
Comparison: None.

CLINICAL DATA: 34-year-old female with persistent focal tenderness
within the upper-outer LEFT breast. This is patient's first
mammogram.

EXAM:
DIGITAL DIAGNOSTIC BILATERAL MAMMOGRAM WITH CAD AND TOMO
ULTRASOUND LEFT BREAST

[Series 1: us breast*left* limited inc axilla · 0.06mm/px · 8 of 8 slices shown]
[im 1/8]
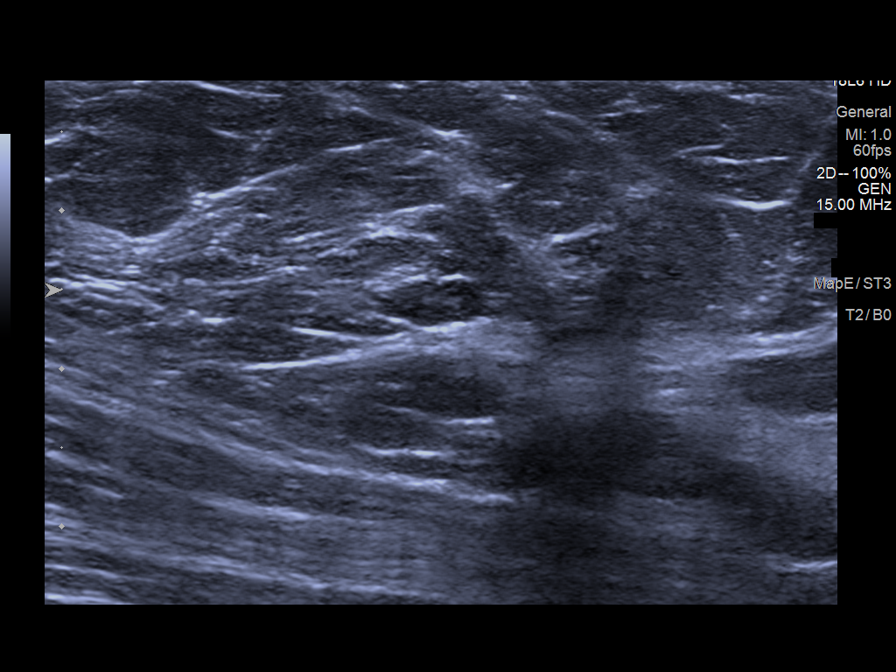
[im 2/8]
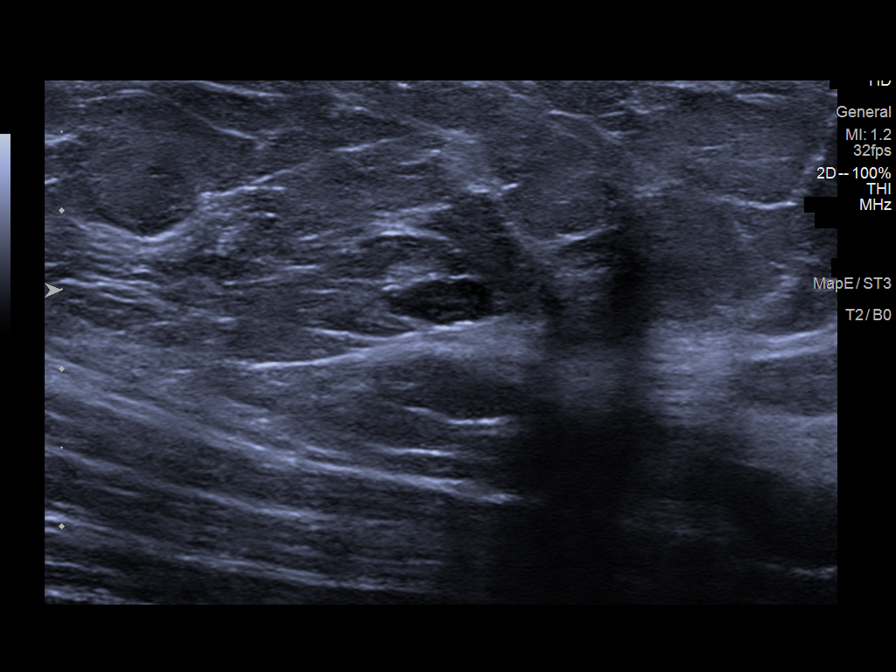
[im 3/8]
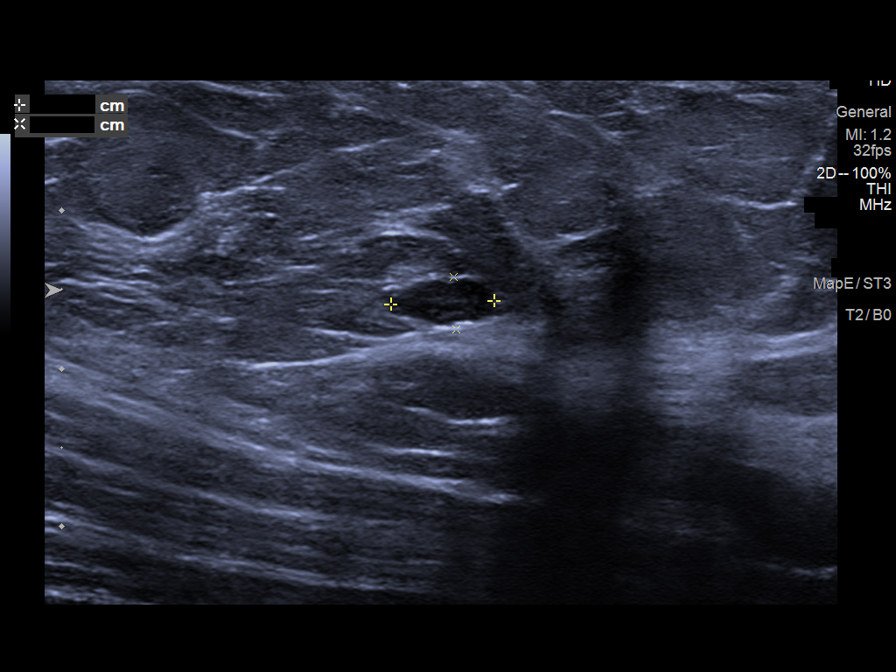
[im 4/8]
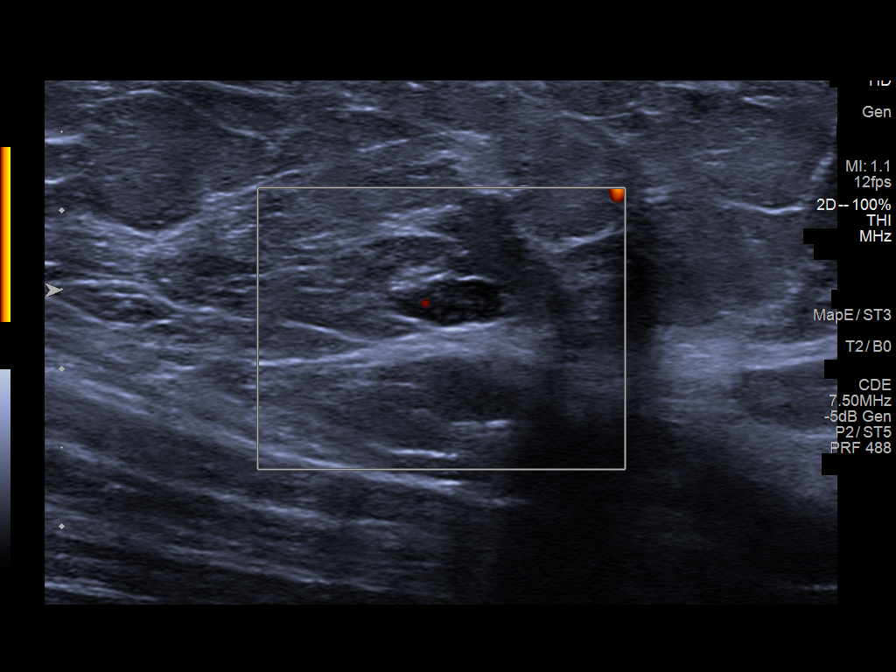
[im 5/8]
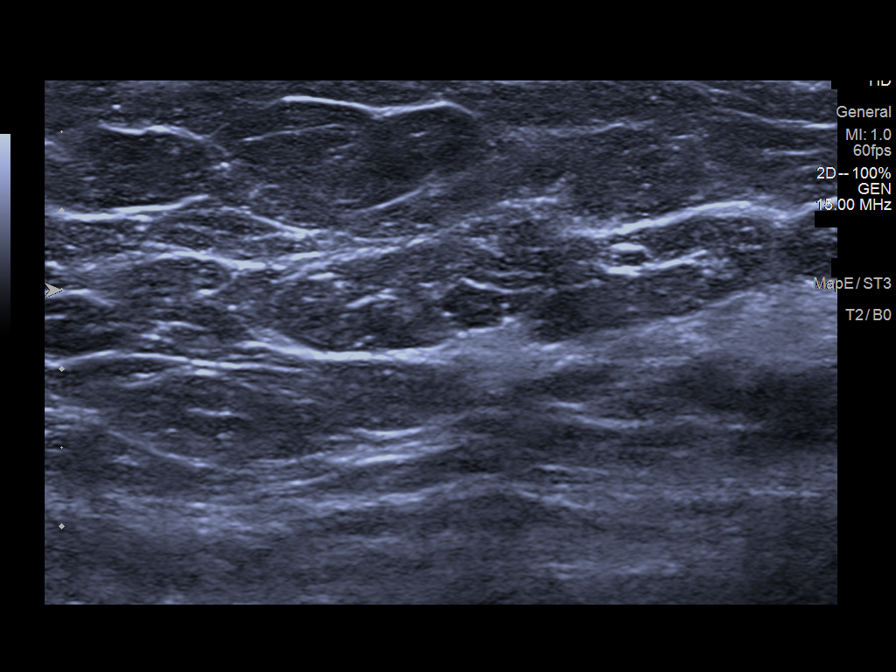
[im 6/8]
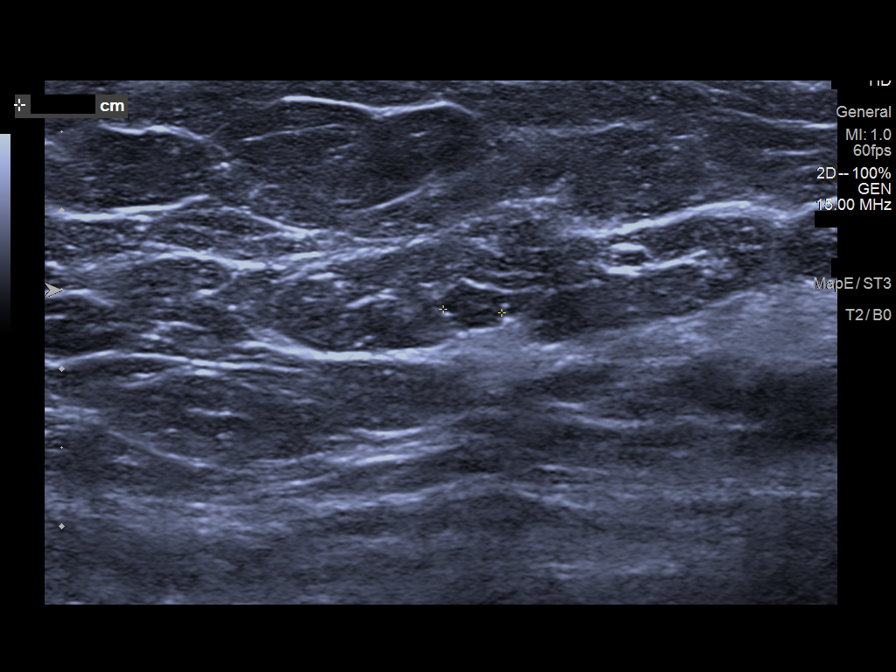
[im 7/8]
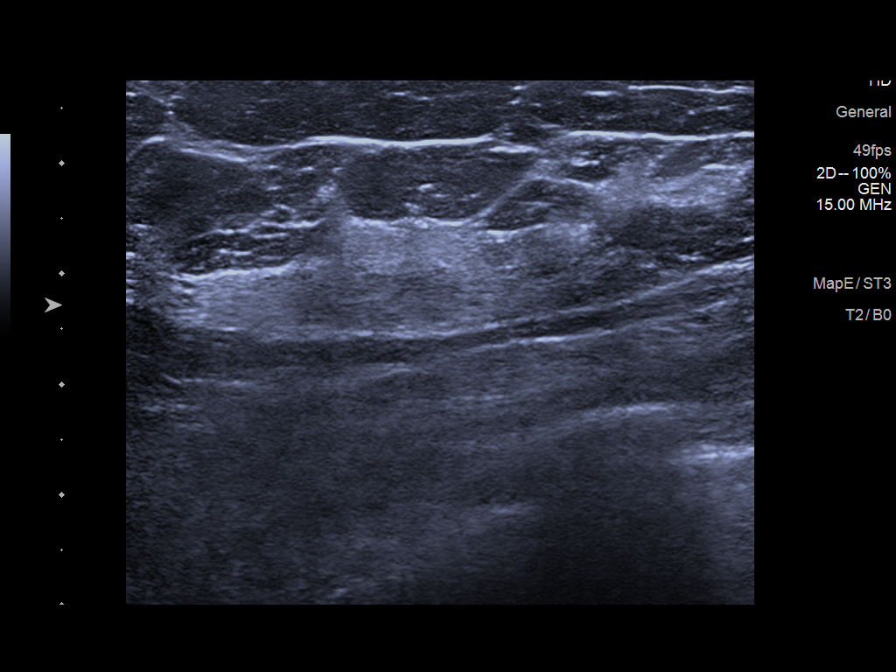
[im 8/8]
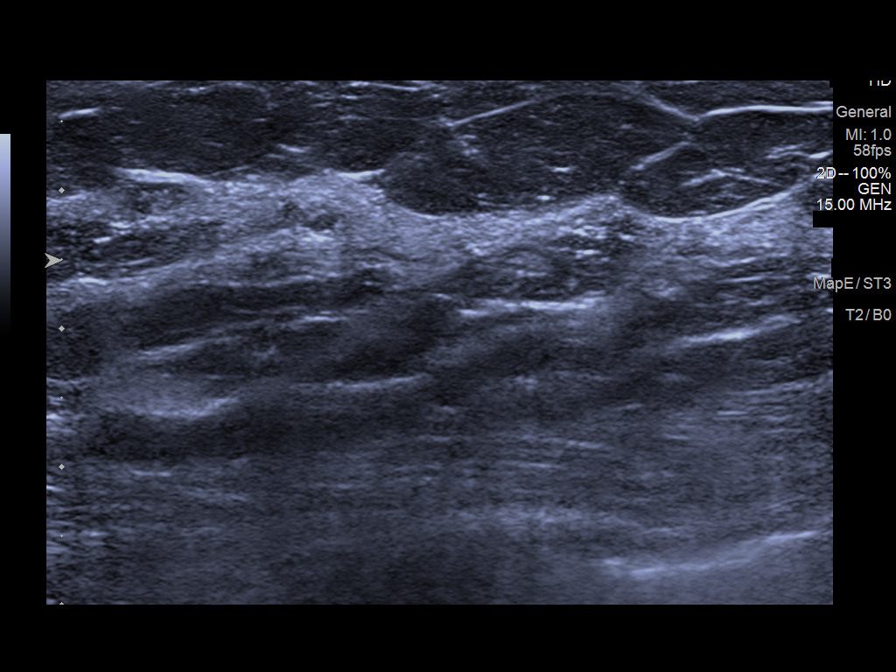

[8 of 8 positions shown; findings below may reference images not displayed]

ACR Breast Density Category b: There are scattered areas of
fibroglandular density.
FINDINGS: There are no dominant masses, suspicious calcifications or secondary
signs of malignancy within either breast.

Mammographic images were processed with CAD.

Targeted ultrasound is performed, evaluating the upper-outer
quadrant of the LEFT breast with particular attention to the 2-3
o'clock axis as directed by the patient, showing an incidental
benign intramammary lymph node at the 1 o'clock axis. No suspicious
solid or cystic mass is identified within the upper-outer quadrant.
IMPRESSION: No evidence of malignancy within either breast. Specifically, no
evidence of malignancy within the upper-outer quadrant of the LEFT
breast corresponding to the area of breast pain.

RECOMMENDATION:
1. Screening mammogram at age 39 unless there are persistent or
intervening clinical concerns. (Code:[id number])
2. Benign causes of breast pain, and possible remedies, were
discussed with the patient. Patient was encouraged to return for
additional imaging if a palpable lump/mass developed.

I have discussed the findings and recommendations with the patient.
If applicable, a reminder letter will be sent to the patient
regarding the next appointment.

BI-RADS CATEGORY  2: Benign.

## 2021-11-21 ENCOUNTER — Other Ambulatory Visit: Payer: Self-pay | Admitting: Family

## 2021-11-21 ENCOUNTER — Other Ambulatory Visit: Payer: Self-pay

## 2021-11-21 DIAGNOSIS — F902 Attention-deficit hyperactivity disorder, combined type: Secondary | ICD-10-CM

## 2021-12-05 ENCOUNTER — Ambulatory Visit: Payer: 59 | Admitting: Family

## 2021-12-31 ENCOUNTER — Other Ambulatory Visit: Payer: Self-pay

## 2022-01-02 ENCOUNTER — Telehealth: Payer: 59 | Admitting: Physician Assistant

## 2022-01-02 ENCOUNTER — Encounter: Payer: Self-pay | Admitting: Family

## 2022-01-02 ENCOUNTER — Other Ambulatory Visit: Payer: Self-pay

## 2022-01-02 DIAGNOSIS — R3989 Other symptoms and signs involving the genitourinary system: Secondary | ICD-10-CM

## 2022-01-02 MED ORDER — SULFAMETHOXAZOLE-TRIMETHOPRIM 800-160 MG PO TABS
1.0000 | ORAL_TABLET | Freq: Two times a day (BID) | ORAL | 0 refills | Status: DC
Start: 2022-01-02 — End: 2022-02-18
  Filled 2022-01-02: qty 10, 5d supply, fill #0

## 2022-01-02 NOTE — Progress Notes (Signed)
Virtual Visit Consent   Megan Tucker, you are scheduled for a virtual visit with a [facility] provider today.     Just as with appointments in the office, your consent must be obtained to participate.  Your consent will be active for this visit and any virtual visit you may have with one of our providers in the next 365 days.     If you have a MyChart account, a copy of this consent can be sent to you electronically.  All virtual visits are billed to your insurance company just like a traditional visit in the office.    As this is a virtual visit, video technology does not allow for your provider to perform a traditional examination.  This may limit your provider's ability to fully assess your condition.  If your provider identifies any concerns that need to be evaluated in person or the need to arrange testing (such as labs, EKG, etc.), we will make arrangements to do so.     Although advances in technology are sophisticated, we cannot ensure that it will always work on either your end or our end.  If the connection with a video visit is poor, the visit may have to be switched to a telephone visit.  With either a video or telephone visit, we are not always able to ensure that we have a secure connection.     I need to obtain your verbal consent now.   Are you willing to proceed with your visit today?    SIVAN EVERHART has provided verbal consent on 01/02/2022 for a virtual visit (video or telephone).   Mar Daring, PA-C   Date: 01/02/2022 4:22 PM   Virtual Visit via Video Note   I, Mar Daring, connected with  ALAZE HARDIN  ([id number], 1986/09/14) on 01/02/22 at  4:15 PM EST by a video-enabled telemedicine application and verified that I am speaking with the correct person using two identifiers.  Location: Patient: Virtual Visit Location Patient: Home Provider: Virtual Visit Location Provider: Home Office   I discussed the limitations of evaluation and management by  telemedicine and the availability of in person appointments. The patient expressed understanding and agreed to proceed.    History of Present Illness: Megan Tucker is a 36 y.o. who identifies as a female who was assigned female at birth, and is being seen today for possible UTI.  HPI: Urinary Tract Infection  This is a new problem. The current episode started in the past 7 days. The problem occurs every urination. The problem has been unchanged. The quality of the pain is described as aching and burning. The pain is mild. There has been no fever. Associated symptoms include frequency, hesitancy and urgency. Pertinent negatives include no chills, flank pain, nausea or vomiting. She has tried increased fluids for the symptoms. Her past medical history is significant for recurrent UTIs.   Possible kidney stone, had sharp left flank pain on Tuesday (worst pain of her life) that radiated to the groin on the left. Started pushing fluids. Pain only lasted for a couple of hours and subsided. Has not had any recurrence. Continued to push fluids. Bladder pressure started following this and has only progressed. Has history of UTIs and this feels similar to previous.  Problems:  Patient Active Problem List   Diagnosis Date Noted   Fatigue 04/02/2021   B12 deficiency 04/01/2021   Breast tenderness 10/04/2020   Tachycardia 01/18/2019   Acute right-sided low back  pain without sciatica 01/11/2019   Menorrhagia with regular cycle 12/14/2018   Obesity 12/14/2018   HTN (hypertension) 05/09/2018   Grief reaction 03/07/2018   Attention deficit hyperactivity disorder (ADHD) 07/22/2016   Binge eating disorder 07/11/2016   Routine physical examination 05/21/2016    Allergies:  Allergies  Allergen Reactions   Amlodipine     Rash on legs   Adhesive [Tape] Rash   Medications:  Current Outpatient Medications:    sulfamethoxazole-trimethoprim (BACTRIM DS) 800-160 MG tablet, Take 1 tablet by mouth 2 (two) times  daily., Disp: 10 tablet, Rfl: 0   amphetamine-dextroamphetamine (ADDERALL XR) 20 MG 24 hr capsule, Take 2 capsules (40 mg total) by mouth every morning., Disp: 60 capsule, Rfl: 0   amphetamine-dextroamphetamine (ADDERALL XR) 20 MG 24 hr capsule, TAKE 2 CAPSULES (40 MG) BY MOUTH EVERY MORNING., Disp: 60 capsule, Rfl: 0   amphetamine-dextroamphetamine (ADDERALL XR) 20 MG 24 hr capsule, Take 2 capsules (40 mg total) by mouth every morning., Disp: 60 capsule, Rfl: 0   amphetamine-dextroamphetamine (ADDERALL) 5 MG tablet, TAKE 1 TABLET BY MOUTH DAILY AS NEEDED (IN THE AFTERNOON FOR BREAKTHROUGH SYMPTOMS)., Disp: 30 tablet, Rfl: 0   cyanocobalamin (,VITAMIN B-12,) 1000 MCG/ML injection, 1000 mcg (1 mL) intramuscular injection in the thigh ( vastus lateralis) once per week for four weeks, followed by 1000 mcg IM injection once per month. (Patient not taking: Reported on 09/03/2021), Disp: 1 mL, Rfl: 15   ferrous sulfate 325 (65 FE) MG tablet, Take 1 tablet (325 mg total) by mouth daily with breakfast. (Patient not taking: Reported on 09/03/2021), Disp: 30 tablet, Rfl: 2   metoprolol succinate (TOPROL-XL) 50 MG 24 hr tablet, TAKE 1 TABLET BY MOUTH DAILY. TAKE WITH OR IMMEDIATELY FOLLOWING A MEAL., Disp: 90 tablet, Rfl: 1  Observations/Objective: Patient is well-developed, well-nourished in no acute distress.  Resting comfortably at home.  Head is normocephalic, atraumatic.  No labored breathing.  Speech is clear and coherent with logical content.  Patient is alert and oriented at baseline.    Assessment and Plan: 1. Suspected UTI - sulfamethoxazole-trimethoprim (BACTRIM DS) 800-160 MG tablet; Take 1 tablet by mouth 2 (two) times daily.  Dispense: 10 tablet; Refill: 0  - Worsening symptoms.  - Will treat empirically with Bactrim   - Continue to push fluids. - She is to seek in person evaluation if symptoms do not improve or if they worsen.    Follow Up Instructions: I discussed the assessment and  treatment plan with the patient. The patient was provided an opportunity to ask questions and all were answered. The patient agreed with the plan and demonstrated an understanding of the instructions.  A copy of instructions were sent to the patient via MyChart unless otherwise noted below.   The patient was advised to call back or seek an in-person evaluation if the symptoms worsen or if the condition fails to improve as anticipated.  Time:  I spent 12 minutes with the patient via telehealth technology discussing the above problems/concerns.    Mar Daring, PA-C

## 2022-01-02 NOTE — Patient Instructions (Signed)
Tyrone Nine, thank you for joining Margaretann Loveless, PA-C for today's virtual visit.  While this provider is not your primary care provider (PCP), if your PCP is located in our provider database this encounter information will be shared with them immediately following your visit.  Consent: (Patient) Megan Tucker provided verbal consent for this virtual visit at the beginning of the encounter.  Current Medications:  Current Outpatient Medications:    sulfamethoxazole-trimethoprim (BACTRIM DS) 800-160 MG tablet, Take 1 tablet by mouth 2 (two) times daily., Disp: 10 tablet, Rfl: 0   amphetamine-dextroamphetamine (ADDERALL XR) 20 MG 24 hr capsule, Take 2 capsules (40 mg total) by mouth every morning., Disp: 60 capsule, Rfl: 0   amphetamine-dextroamphetamine (ADDERALL XR) 20 MG 24 hr capsule, TAKE 2 CAPSULES (40 MG) BY MOUTH EVERY MORNING., Disp: 60 capsule, Rfl: 0   amphetamine-dextroamphetamine (ADDERALL XR) 20 MG 24 hr capsule, Take 2 capsules (40 mg total) by mouth every morning., Disp: 60 capsule, Rfl: 0   amphetamine-dextroamphetamine (ADDERALL) 5 MG tablet, TAKE 1 TABLET BY MOUTH DAILY AS NEEDED (IN THE AFTERNOON FOR BREAKTHROUGH SYMPTOMS)., Disp: 30 tablet, Rfl: 0   cyanocobalamin (,VITAMIN B-12,) 1000 MCG/ML injection, 1000 mcg (1 mL) intramuscular injection in the thigh ( vastus lateralis) once per week for four weeks, followed by 1000 mcg IM injection once per month. (Patient not taking: Reported on 09/03/2021), Disp: 1 mL, Rfl: 15   ferrous sulfate 325 (65 FE) MG tablet, Take 1 tablet (325 mg total) by mouth daily with breakfast. (Patient not taking: Reported on 09/03/2021), Disp: 30 tablet, Rfl: 2   metoprolol succinate (TOPROL-XL) 50 MG 24 hr tablet, TAKE 1 TABLET BY MOUTH DAILY. TAKE WITH OR IMMEDIATELY FOLLOWING A MEAL., Disp: 90 tablet, Rfl: 1   Medications ordered in this encounter:  Meds ordered this encounter  Medications   sulfamethoxazole-trimethoprim (BACTRIM DS) 800-160  MG tablet    Sig: Take 1 tablet by mouth 2 (two) times daily.    Dispense:  10 tablet    Refill:  0    Order Specific Question:   Supervising Provider    Answer:   Hyacinth Meeker, BRIAN [[id number]]     *If you need refills on other medications prior to your next appointment, please contact your pharmacy*  Follow-Up: Call back or seek an in-person evaluation if the symptoms worsen or if the condition fails to improve as anticipated.  Other Instructions Urinary Tract Infection, Adult A urinary tract infection (UTI) is an infection of any part of the urinary tract. The urinary tract includes the kidneys, ureters, bladder, and urethra. These organs make, store, and get rid of urine in the body. An upper UTI affects the ureters and kidneys. A lower UTI affects the bladder and urethra. What are the causes? Most urinary tract infections are caused by bacteria in your genital area around your urethra, where urine leaves your body. These bacteria grow and cause inflammation of your urinary tract. What increases the risk? You are more likely to develop this condition if: You have a urinary catheter that stays in place. You are not able to control when you urinate or have a bowel movement (incontinence). You are female and you: Use a spermicide or diaphragm for birth control. Have low estrogen levels. Are pregnant. You have certain genes that increase your risk. You are sexually active. You take antibiotic medicines. You have a condition that causes your flow of urine to slow down, such as: An enlarged prostate, if you are female. Blockage  in your urethra. A kidney stone. A nerve condition that affects your bladder control (neurogenic bladder). Not getting enough to drink, or not urinating often. You have certain medical conditions, such as: Diabetes. A weak disease-fighting system (immunesystem). Sickle cell disease. Gout. Spinal cord injury. What are the signs or symptoms? Symptoms of this  condition include: Needing to urinate right away (urgency). Frequent urination. This may include small amounts of urine each time you urinate. Pain or burning with urination. Blood in the urine. Urine that smells bad or unusual. Trouble urinating. Cloudy urine. Vaginal discharge, if you are female. Pain in the abdomen or the lower back. You may also have: Vomiting or a decreased appetite. Confusion. Irritability or tiredness. A fever or chills. Diarrhea. The first symptom in older adults may be confusion. In some cases, they may not have any symptoms until the infection has worsened. How is this diagnosed? This condition is diagnosed based on your medical history and a physical exam. You may also have other tests, including: Urine tests. Blood tests. Tests for STIs (sexually transmitted infections). If you have had more than one UTI, a cystoscopy or imaging studies may be done to determine the cause of the infections. How is this treated? Treatment for this condition includes: Antibiotic medicine. Over-the-counter medicines to treat discomfort. Drinking enough water to stay hydrated. If you have frequent infections or have other conditions such as a kidney stone, you may need to see a health care provider who specializes in the urinary tract (urologist). In rare cases, urinary tract infections can cause sepsis. Sepsis is a life-threatening condition that occurs when the body responds to an infection. Sepsis is treated in the hospital with IV antibiotics, fluids, and other medicines. Follow these instructions at home: Medicines Take over-the-counter and prescription medicines only as told by your health care provider. If you were prescribed an antibiotic medicine, take it as told by your health care provider. Do not stop using the antibiotic even if you start to feel better. General instructions Make sure you: Empty your bladder often and completely. Do not hold urine for long  periods of time. Empty your bladder after sex. Wipe from front to back after urinating or having a bowel movement if you are female. Use each tissue only one time when you wipe. Drink enough fluid to keep your urine pale yellow. Keep all follow-up visits. This is important. Contact a health care provider if: Your symptoms do not get better after 1-2 days. Your symptoms go away and then return. Get help right away if: You have severe pain in your back or your lower abdomen. You have a fever or chills. You have nausea or vomiting. Summary A urinary tract infection (UTI) is an infection of any part of the urinary tract, which includes the kidneys, ureters, bladder, and urethra. Most urinary tract infections are caused by bacteria in your genital area. Treatment for this condition often includes antibiotic medicines. If you were prescribed an antibiotic medicine, take it as told by your health care provider. Do not stop using the antibiotic even if you start to feel better. Keep all follow-up visits. This is important. This information is not intended to replace advice given to you by your health care provider. Make sure you discuss any questions you have with your health care provider. Document Revised: 06/21/2020 Document Reviewed: 06/21/2020 Elsevier Patient Education  2022 [employer].    If you have been instructed to have an in-person evaluation today at a local Urgent Care  facility, please use the link below. It will take you to a list of all of our available [facility], including address, phone number and hours of operation. Please do not delay care.  [facility]  If you or a family member do not have a primary care provider, use the link below to schedule a visit and establish care. When you choose a [facility] primary care physician or advanced practice provider, you gain a long-term partner in health. Find a Primary Care Provider  Learn more about  [facility]'s in-office and virtual care options: [facility] - Get Care Now

## 2022-01-08 ENCOUNTER — Emergency Department
Admission: EM | Admit: 2022-01-08 | Discharge: 2022-01-08 | Disposition: A | Discharge status: Home / Self Care | Payer: 59 | Attending: Emergency Medicine | Admitting: Emergency Medicine

## 2022-01-08 ENCOUNTER — Emergency Department: Payer: 59

## 2022-01-08 ENCOUNTER — Ambulatory Visit
Admission: RE | Admit: 2022-01-08 | Discharge: 2022-01-08 | Disposition: A | Discharge status: Other Acute Inpatient Hospital | Payer: 59 | Source: Ambulatory Visit

## 2022-01-08 ENCOUNTER — Telehealth: Payer: 59 | Admitting: Physician Assistant

## 2022-01-08 ENCOUNTER — Other Ambulatory Visit: Payer: Self-pay

## 2022-01-08 DIAGNOSIS — N83209 Unspecified ovarian cyst, unspecified side: Secondary | ICD-10-CM

## 2022-01-08 DIAGNOSIS — N12 Tubulo-interstitial nephritis, not specified as acute or chronic: Secondary | ICD-10-CM

## 2022-01-08 DIAGNOSIS — M545 Low back pain, unspecified: Secondary | ICD-10-CM | POA: Diagnosis not present

## 2022-01-08 DIAGNOSIS — N3000 Acute cystitis without hematuria: Secondary | ICD-10-CM | POA: Diagnosis not present

## 2022-01-08 DIAGNOSIS — N83291 Other ovarian cyst, right side: Secondary | ICD-10-CM | POA: Insufficient documentation

## 2022-01-08 DIAGNOSIS — N83201 Unspecified ovarian cyst, right side: Secondary | ICD-10-CM | POA: Diagnosis not present

## 2022-01-08 DIAGNOSIS — I10 Essential (primary) hypertension: Secondary | ICD-10-CM | POA: Diagnosis not present

## 2022-01-08 DIAGNOSIS — N200 Calculus of kidney: Secondary | ICD-10-CM | POA: Diagnosis not present

## 2022-01-08 DIAGNOSIS — R102 Pelvic and perineal pain: Secondary | ICD-10-CM

## 2022-01-08 DIAGNOSIS — R309 Painful micturition, unspecified: Secondary | ICD-10-CM | POA: Diagnosis not present

## 2022-01-08 LAB — COMPREHENSIVE METABOLIC PANEL
ALT: 24 U/L (ref 0–44)
AST: 24 U/L (ref 15–41)
Albumin: 3.8 g/dL (ref 3.5–5.0)
Alkaline Phosphatase: 51 U/L (ref 38–126)
Anion gap: 5 (ref 5–15)
BUN: 12 mg/dL (ref 6–20)
CO2: 23 mmol/L (ref 22–32)
Calcium: 8.6 mg/dL — ABNORMAL LOW (ref 8.9–10.3)
Chloride: 107 mmol/L (ref 98–111)
Creatinine, Ser: 0.92 mg/dL (ref 0.44–1.00)
GFR, Estimated: >60 mL/min (ref >60)
Glucose, Bld: 164 mg/dL — ABNORMAL HIGH (ref 70–99)
Potassium: 3.7 mmol/L (ref 3.5–5.1)
Sodium: 135 mmol/L (ref 135–145)
Total Bilirubin: 0.4 mg/dL (ref 0.3–1.2)
Total Protein: 6.8 g/dL (ref 6.5–8.1)

## 2022-01-08 LAB — CBC
HCT: 34.9 % — ABNORMAL LOW (ref 36.0–46.0)
Hemoglobin: 10.7 g/dL — ABNORMAL LOW (ref 12.0–15.0)
MCH: 24.1 pg — ABNORMAL LOW (ref 26.0–34.0)
MCHC: 30.7 g/dL (ref 30.0–36.0)
MCV: 78.6 fL — ABNORMAL LOW (ref 80.0–100.0)
Platelets: 245 10*3/uL (ref 150–400)
RBC: 4.44 MIL/uL (ref 3.87–5.11)
RDW: 16.6 % — ABNORMAL HIGH (ref 11.5–15.5)
WBC: 3 10*3/uL — ABNORMAL LOW (ref 4.0–10.5)
nRBC: 0 % (ref 0.0–0.2)

## 2022-01-08 LAB — URINALYSIS, ROUTINE W REFLEX MICROSCOPIC
Bilirubin Urine: NEGATIVE
Glucose, UA: NEGATIVE mg/dL
Hgb urine dipstick: NEGATIVE
Ketones, ur: 80 mg/dL — AB
Leukocytes,Ua: NEGATIVE
Nitrite: NEGATIVE
Protein, ur: NEGATIVE mg/dL
Specific Gravity, Urine: 1.014 (ref 1.005–1.030)
pH: 5 (ref 5.0–8.0)

## 2022-01-08 LAB — WET PREP, GENITAL
Clue Cells Wet Prep HPF POC: NONE SEEN
Sperm: NONE SEEN
Trich, Wet Prep: NONE SEEN
WBC, Wet Prep HPF POC: <10 (ref <10)
Yeast Wet Prep HPF POC: NONE SEEN

## 2022-01-08 LAB — LIPASE, BLOOD: Lipase: 29 U/L (ref 11–51)

## 2022-01-08 LAB — CHLAMYDIA/NGC RT PCR (ARMC ONLY)
Chlamydia Tr: NOT DETECTED
N gonorrhoeae: NOT DETECTED

## 2022-01-08 LAB — POC URINE PREG, ED: Preg Test, Ur: NEGATIVE

## 2022-01-08 MED ORDER — SODIUM CHLORIDE 0.9 % IV BOLUS
1000.0000 mL | Freq: Once | INTRAVENOUS | Status: AC
  Administered 2022-01-08: 1000 mL via INTRAVENOUS

## 2022-01-08 NOTE — ED Triage Notes (Signed)
Pt comes with c/o lower pelvic pain that has been on going. Pt states she was started on meds and took last pill yesterday. Pt states still having intense pain and pain when urinating.

## 2022-01-08 NOTE — Progress Notes (Signed)
Virtual Visit Consent   Megan Tucker, you are scheduled for a virtual visit with a [facility] provider today.     Just as with appointments in the office, your consent must be obtained to participate.  Your consent will be active for this visit and any virtual visit you may have with one of our providers in the next 365 days.     If you have a MyChart account, a copy of this consent can be sent to you electronically.  All virtual visits are billed to your insurance company just like a traditional visit in the office.    As this is a virtual visit, video technology does not allow for your provider to perform a traditional examination.  This may limit your provider's ability to fully assess your condition.  If your provider identifies any concerns that need to be evaluated in person or the need to arrange testing (such as labs, EKG, etc.), we will make arrangements to do so.     Although advances in technology are sophisticated, we cannot ensure that it will always work on either your end or our end.  If the connection with a video visit is poor, the visit may have to be switched to a telephone visit.  With either a video or telephone visit, we are not always able to ensure that we have a secure connection.     I need to obtain your verbal consent now.   Are you willing to proceed with your visit today?    Megan Tucker has provided verbal consent on 01/08/2022 for a virtual visit (video or telephone).   Leeanne Rio, [place]   Date: 01/08/2022 11:26 AM   Virtual Visit via Video Note   I, Leeanne Rio, connected with  Megan Tucker  ([id number], 02/09/1986) on 01/08/22 at 11:15 AM EST by a video-enabled telemedicine application and verified that I am speaking with the correct person using two identifiers.  Location: Patient: Virtual Visit Location Patient: Home Provider: Virtual Visit Location Provider: Home Office   I discussed the limitations of evaluation and management by  telemedicine and the availability of in person appointments. The patient expressed understanding and agreed to proceed.    History of Present Illness: Megan Tucker is a 36 y.o. who identifies as a female who was assigned female at birth, and is being seen today for continued and progressing UTI symptoms. Notes last Tuesday experiencing  urinary urgency, frequency and L flank pain worsened throughout the day. Subsided. Since then has had intermittent L flank pain, pressure, urinary urgency and frequency. No hematuria. Was seen via e-visit on 2/10 for UTI symptoms, not mentioning the prior issue. Was started on Bactrim for suspect UTI. States she took as directed with significant improvement in symptoms until mostly resolved but after finishing antibiotics symptoms returned with urgency, frequency, now with fever and chills. Denies any noted recurrence of back pain. Reached out to her PCP office and was told by CMA they could not see  her until tomorrow and for her to do a video visit.  HPI: HPI  Problems:  Patient Active Problem List   Diagnosis Date Noted   Fatigue 04/02/2021   B12 deficiency 04/01/2021   Breast tenderness 10/04/2020   Tachycardia 01/18/2019   Acute right-sided low back pain without sciatica 01/11/2019   Menorrhagia with regular cycle 12/14/2018   Obesity 12/14/2018   HTN (hypertension) 05/09/2018   Grief reaction 03/07/2018   Attention deficit hyperactivity disorder (  ADHD) 07/22/2016   Binge eating disorder 07/11/2016   Routine physical examination 05/21/2016    Allergies:  Allergies  Allergen Reactions   Amlodipine     Rash on legs   Adhesive [Tape] Rash   Medications:  Current Outpatient Medications:    amphetamine-dextroamphetamine (ADDERALL XR) 20 MG 24 hr capsule, Take 2 capsules (40 mg total) by mouth every morning., Disp: 60 capsule, Rfl: 0   amphetamine-dextroamphetamine (ADDERALL XR) 20 MG 24 hr capsule, TAKE 2 CAPSULES (40 MG) BY MOUTH EVERY MORNING.,  Disp: 60 capsule, Rfl: 0   amphetamine-dextroamphetamine (ADDERALL XR) 20 MG 24 hr capsule, Take 2 capsules (40 mg total) by mouth every morning., Disp: 60 capsule, Rfl: 0   amphetamine-dextroamphetamine (ADDERALL) 5 MG tablet, TAKE 1 TABLET BY MOUTH DAILY AS NEEDED (IN THE AFTERNOON FOR BREAKTHROUGH SYMPTOMS)., Disp: 30 tablet, Rfl: 0   cyanocobalamin (,VITAMIN B-12,) 1000 MCG/ML injection, 1000 mcg (1 mL) intramuscular injection in the thigh ( vastus lateralis) once per week for four weeks, followed by 1000 mcg IM injection once per month. (Patient not taking: Reported on 09/03/2021), Disp: 1 mL, Rfl: 15   ferrous sulfate 325 (65 FE) MG tablet, Take 1 tablet (325 mg total) by mouth daily with breakfast. (Patient not taking: Reported on 09/03/2021), Disp: 30 tablet, Rfl: 2   metoprolol succinate (TOPROL-XL) 50 MG 24 hr tablet, TAKE 1 TABLET BY MOUTH DAILY. TAKE WITH OR IMMEDIATELY FOLLOWING A MEAL., Disp: 90 tablet, Rfl: 1   sulfamethoxazole-trimethoprim (BACTRIM DS) 800-160 MG tablet, Take 1 tablet by mouth 2 (two) times daily., Disp: 10 tablet, Rfl: 0  Observations/Objective: Patient is well-developed, well-nourished in no acute distress.  Resting comfortably at home.  Head is normocephalic, atraumatic.  No labored breathing. Speech is clear and coherent with logical content.  Patient is alert and oriented at baseline.   Assessment and Plan: 1. Pyelonephritis  Concern for start of more complicated UTI giving recurrence of symptoms now with chills and low-grade fever. She has been triaged for urgent in person evaluation today. Links given with addressed to our facilities.   Follow Up Instructions: I discussed the assessment and treatment plan with the patient. The patient was provided an opportunity to ask questions and all were answered. The patient agreed with the plan and demonstrated an understanding of the instructions.  A copy of instructions were sent to the patient via MyChart unless  otherwise noted below.   The patient was advised to call back or seek an in-person evaluation if the symptoms worsen or if the condition fails to improve as anticipated.  Time:  I spent 12 minutes with the patient via telehealth technology discussing the above problems/concerns.    Leeanne Rio, PA-C

## 2022-01-08 NOTE — Patient Instructions (Signed)
°  Tyrone Nine, thank you for joining Piedad Climes, PA-C for today's virtual visit.  While this provider is not your primary care provider (PCP), if your PCP is located in our provider database this encounter information will be shared with them immediately following your visit.  Consent: (Patient) Megan Tucker provided verbal consent for this virtual visit at the beginning of the encounter.  Current Medications:  Current Outpatient Medications:    amphetamine-dextroamphetamine (ADDERALL XR) 20 MG 24 hr capsule, Take 2 capsules (40 mg total) by mouth every morning., Disp: 60 capsule, Rfl: 0   amphetamine-dextroamphetamine (ADDERALL XR) 20 MG 24 hr capsule, TAKE 2 CAPSULES (40 MG) BY MOUTH EVERY MORNING., Disp: 60 capsule, Rfl: 0   amphetamine-dextroamphetamine (ADDERALL XR) 20 MG 24 hr capsule, Take 2 capsules (40 mg total) by mouth every morning., Disp: 60 capsule, Rfl: 0   amphetamine-dextroamphetamine (ADDERALL) 5 MG tablet, TAKE 1 TABLET BY MOUTH DAILY AS NEEDED (IN THE AFTERNOON FOR BREAKTHROUGH SYMPTOMS)., Disp: 30 tablet, Rfl: 0   cyanocobalamin (,VITAMIN B-12,) 1000 MCG/ML injection, 1000 mcg (1 mL) intramuscular injection in the thigh ( vastus lateralis) once per week for four weeks, followed by 1000 mcg IM injection once per month. (Patient not taking: Reported on 09/03/2021), Disp: 1 mL, Rfl: 15   ferrous sulfate 325 (65 FE) MG tablet, Take 1 tablet (325 mg total) by mouth daily with breakfast. (Patient not taking: Reported on 09/03/2021), Disp: 30 tablet, Rfl: 2   metoprolol succinate (TOPROL-XL) 50 MG 24 hr tablet, TAKE 1 TABLET BY MOUTH DAILY. TAKE WITH OR IMMEDIATELY FOLLOWING A MEAL., Disp: 90 tablet, Rfl: 1   sulfamethoxazole-trimethoprim (BACTRIM DS) 800-160 MG tablet, Take 1 tablet by mouth 2 (two) times daily., Disp: 10 tablet, Rfl: 0   Medications ordered in this encounter:  No orders of the defined types were placed in this encounter.    *If you need refills on  other medications prior to your next appointment, please contact your pharmacy*  Follow-Up: Call back or seek an in-person evaluation if the symptoms worsen or if the condition fails to improve as anticipated.  Other Instructions You need an in-person evaluation ASAP. I would recommend ER just due to concern for possible kidney stone as well as worsening UTI, but you at least must be evaluated at [facility] if your PCP cannot see you today.    If you have been instructed to have an in-person evaluation today at a local Urgent Care facility, please use the link below. It will take you to a list of all of our available [facility], including address, phone number and hours of operation. Please do not delay care.  [facility]  If you or a family member do not have a primary care provider, use the link below to schedule a visit and establish care. When you choose a [facility] primary care physician or advanced practice provider, you gain a long-term partner in health. Find a Primary Care Provider  Learn more about [facility] in-office and virtual care options: [facility] - Get Care Now

## 2022-01-08 NOTE — Discharge Instructions (Addendum)
Your ultrasound is as below.  We will hold off on antibiotics at this time but your urine culture is in process and should come back in the next 2 days.  If you develop return of symptoms before then he should come to the ER to be evaluated.  If it still grows something that you may need a longer course of antibiotics.     IMPRESSION: 1. Hemorrhagic cyst right ovary measuring 1.8 cm. 2. Small amount of free fluid within the lower pelvis. 3. 1.2 cm echogenic structure within the endometrial cavity, compatible with polyp. Further evaluation with sonohysterogram or hysteroscopy could be considered.

## 2022-01-08 NOTE — ED Provider Notes (Signed)
[facility] Provider Note    Event Date/Time   First MD Initiated Contact with Patient 01/08/22 1904     (approximate)   History   Abdominal Pain   HPI  Megan Tucker is a 36 y.o. female  with anemia who comes in with lower pelvic pressure. She reports last Tuesday she think she had some left kidney pain. Then she got placed on bactrim for possible UTI. But still having some pressure in the pelvic region. She had fever of 100.1 and she was told to come to ER.  She took ibuprofen this morning but nothing recently She finished bactrim course yesterday.  Patient reports the fever was this morning and she has not had any recurrent fever.  Patient reports sexual to be with 1 person and had STD testing that has been negative without any new partner since then. Denies vaginal symptoms.    Physical Exam   Triage Vital Signs: ED Triage Vitals  Enc Vitals Group     BP 01/08/22 1529 (!) 169/109     Pulse Rate 01/08/22 1529 (!) 103     Resp 01/08/22 1529 18     Temp 01/08/22 1529 98 F (36.7 C)     Temp Source 01/08/22 1910 Oral     SpO2 01/08/22 1529 96 %     Weight --      Height --      Head Circumference --      Peak Flow --      Pain Score 01/08/22 1528 8     Pain Loc --      Pain Edu? --      Excl. in [place]? --     Most recent vital signs: Vitals:   01/08/22 1529 01/08/22 1910  BP: (!) 169/109 (!) 184/116  Pulse: (!) 103 99  Resp: 18   Temp: 98 F (36.7 C) 98.3 F (36.8 C)  SpO2: 96% 100%     General: Awake, no distress.  CV:  Good peripheral perfusion.  Tachy  Resp:  Normal effort.  Abd:  No distention.  Soft nontender Other:     ED Results / Procedures / Treatments   Labs (all labs ordered are listed, but only abnormal results are displayed) Labs Reviewed  COMPREHENSIVE METABOLIC PANEL - Abnormal; Notable for the following components:      Result Value   Glucose, Bld 164 (*)    Calcium 8.6 (*)    All other components within  normal limits  CBC - Abnormal; Notable for the following components:   WBC 3.0 (*)    Hemoglobin 10.7 (*)    HCT 34.9 (*)    MCV 78.6 (*)    MCH 24.1 (*)    RDW 16.6 (*)    All other components within normal limits  URINALYSIS, ROUTINE W REFLEX MICROSCOPIC - Abnormal; Notable for the following components:   Color, Urine YELLOW (*)    APPearance CLEAR (*)    Ketones, ur 80 (*)    All other components within normal limits  LIPASE, BLOOD  POC URINE PREG, ED     RADIOLOGY I have reviewed the ct  personally  kidney stone in kidney   Waiting on read IMPRESSION: Nonobstructing 3 mm RIGHT renal calculus.   Enlargement and low-attenuation of RIGHT ovary versus LEFT, with small amount of nonspecific free pelvic fluid, may potentially represent a ruptured RIGHT ovarian cyst; may consider follow-up pelvic sonography if clinically indicated.   Remainder of  exam unremarkable.     PROCEDURES:  Critical Care performed: No  Procedures   MEDICATIONS ORDERED IN ED: Medications - No data to display   IMPRESSION / MDM / [place] / ED COURSE  I reviewed the triage vital signs and the nursing notes.                              Differential diagnosis includes, but is not limited to, kidney stone, pyelonephritis.  Given continued pain will get CT scan to make sure no evidence of pyelonephritis, cystitis, kidney stone.  Lower suspicion for ovarian pathology.  Pregnancy test to make sure not ectopic.  Preg test negative Lipase normal  CMP normal  CBC low white count prob from recent UTI hemoglobin 10.7 similar to priors.  UA ketones  Given ketones in urine will get 1 L of fluids  CT scan without evidence of kidney stone or pyelonephritis.  Given patient's urine now does not show any UTI I had a lengthy discussion with patient given symptoms are now mostly resolved on further than the one fever this morning she is afebrile now and she feels comfortable with holding off  on antibiotics until urine culture comes back but she can return to the ER if she develops return of symptoms over the weekend.  We discussed this could have been a partially treated UTI and she may need a longer course but I would like to wait for the urine culture given patient is already undergone 5 days of Bactrim.  Patient expressed understanding felt very comfortable with this plan given she states that she feels mostly at baseline at this time.  Given this concern of a hemorrhagic cyst we also got ultrasound which I will provide her for a copy of the results but no evidence of torsion that could be causing her discomfort.  She is going to follow this up with an OB doctor.    The patient is on the cardiac monitor to evaluate for evidence of arrhythmia and/or significant heart rate changes.   FINAL CLINICAL IMPRESSION(S) / ED DIAGNOSES   Final diagnoses:  Pelvic pain  Acute cystitis without hematuria  Hemorrhagic cyst of ovary     Rx / DC Orders   ED Discharge Orders     None        Note:  This document was prepared using Dragon voice recognition software and may include unintentional dictation errors.   Vanessa Hernandez, MD 01/08/22 2221

## 2022-01-08 NOTE — ED Notes (Signed)
Pt in CT.

## 2022-01-08 NOTE — ED Notes (Signed)
Pt to US.

## 2022-01-09 ENCOUNTER — Ambulatory Visit: Payer: 59 | Admitting: Family

## 2022-01-10 LAB — URINE CULTURE

## 2022-01-11 ENCOUNTER — Other Ambulatory Visit (HOSPITAL_COMMUNITY): Payer: Self-pay

## 2022-01-11 MED FILL — Metoprolol Succinate Tab ER 24HR 50 MG (Tartrate Equiv): ORAL | 90 days supply | Qty: 90 | Fill #0 | Status: AC

## 2022-01-12 ENCOUNTER — Other Ambulatory Visit (HOSPITAL_COMMUNITY): Payer: Self-pay

## 2022-01-30 ENCOUNTER — Other Ambulatory Visit: Payer: Self-pay

## 2022-01-30 ENCOUNTER — Other Ambulatory Visit: Payer: Self-pay | Admitting: Family

## 2022-01-30 DIAGNOSIS — F902 Attention-deficit hyperactivity disorder, combined type: Secondary | ICD-10-CM

## 2022-01-30 MED ORDER — AMPHETAMINE-DEXTROAMPHET ER 20 MG PO CP24
40.0000 mg | ORAL_CAPSULE | ORAL | 0 refills | Status: DC
  Filled 2022-01-30: qty 60, 30d supply, fill #0

## 2022-02-03 ENCOUNTER — Encounter: Payer: Self-pay | Admitting: Family

## 2022-02-04 ENCOUNTER — Ambulatory Visit: Payer: 59 | Admitting: Family

## 2022-02-05 NOTE — Telephone Encounter (Signed)
I will cancel the no show fee.  ?

## 2022-02-16 ENCOUNTER — Ambulatory Visit: Payer: 59 | Admitting: Family

## 2022-02-18 ENCOUNTER — Encounter: Payer: Self-pay | Admitting: Family

## 2022-02-18 ENCOUNTER — Ambulatory Visit (INDEPENDENT_AMBULATORY_CARE_PROVIDER_SITE_OTHER): Payer: 59 | Admitting: Family

## 2022-02-18 ENCOUNTER — Other Ambulatory Visit: Payer: Self-pay

## 2022-02-18 DIAGNOSIS — N840 Polyp of corpus uteri: Secondary | ICD-10-CM | POA: Insufficient documentation

## 2022-02-18 DIAGNOSIS — F902 Attention-deficit hyperactivity disorder, combined type: Secondary | ICD-10-CM

## 2022-02-18 DIAGNOSIS — I10 Essential (primary) hypertension: Secondary | ICD-10-CM | POA: Diagnosis not present

## 2022-02-18 LAB — POCT GLYCOSYLATED HEMOGLOBIN (HGB A1C): Hemoglobin A1C: 6.6 % — AB (ref 4.0–5.6)

## 2022-02-18 NOTE — Assessment & Plan Note (Signed)
Right-sided pelvic pain completely resolved.  Unable to see if red blood cells resulted on urine from 01/08/22  therefore I am repeating urinalysis today; patient will leave at [facility] as she is on her menses today and will wait a couple of weeks prior to collecting.  Also placed referral to GYN, Dr. Valentino Saxon to evaluate patient regarding concern for endometrial polyp and right hemorrhagic ovarian cyst ?

## 2022-02-18 NOTE — Assessment & Plan Note (Signed)
Chronic, stable.  Continue Adderall XR 40 mg on work days; Adderall IR 5 mg in the afternoon as needed. 

## 2022-02-18 NOTE — Assessment & Plan Note (Signed)
Excellent control.  Continue Toprol 50 mg ?

## 2022-02-18 NOTE — Patient Instructions (Signed)
Nice to see you! ? ?Referral to GYN, Dr. Valentino Saxon ?Let us know if you dont hear back within a week in regards to an appointment being scheduled.  ? ?As discussed, please leave urine at your convenience at [facility] mall. ?

## 2022-02-18 NOTE — Progress Notes (Signed)
? ?Subjective:  ? ? Patient ID: Megan Tucker, female    DOB: Apr 16, 1986, 36 y.o.   MRN: [id number] ? ?CC: Megan Tucker is a 36 y.o. female who presents today for follow up.  ? ?HPI: Right pelvic pain has completely resolved ?Menses have been normal. She is on her menses today.  ?No dysuria, urinary frequency ?Pelvic ultrasound 01/08/2022 shows 1.1 cm echogenic structure within the ?endometrial cavity consistent with polyp, Hemorrhagic cyst right ovary measuring 1.8 cm. ? ? ? ?HTN- compliant with Toprol 50 mg. At work, BP is 120/80. No cp, sob.  ? ?ADHD-compliant with Adderall XR 40 mg on work days; Adderall IR 5 mg in the afternoon as needed.  Feels medication regimen continues to work well for her ?HISTORY:  ?Past Medical History:  ?Diagnosis Date  ? Anemia   ? Anemia   ? Bacterial vaginitis   ? Gestational diabetes   ? Hypertension   ? ?Past Surgical History:  ?Procedure Laterality Date  ? MOUTH SURGERY    ? TUBAL LIGATION  2014  ? ?Family History  ?Problem Relation Age of Onset  ? Drug abuse Mother   ?     narcotic addition  ? Stroke Mother   ? Hypertension Mother   ? Mental illness Mother   ? Diabetes Mother   ? Diabetes Father   ? Hypertension Maternal Grandmother   ? Diabetes Maternal Grandmother   ? Diabetes Maternal Grandfather   ? Diabetes Paternal Grandmother   ? Diabetes Paternal Grandfather   ? Other Neg Hx   ? ? ?Allergies: Amlodipine and Adhesive [tape] ?Current Outpatient Medications on File Prior to Visit  ?Medication Sig Dispense Refill  ? amphetamine-dextroamphetamine (ADDERALL XR) 20 MG 24 hr capsule Take 2 capsules (40 mg total) by mouth every morning. 60 capsule 0  ? amphetamine-dextroamphetamine (ADDERALL XR) 20 MG 24 hr capsule TAKE 2 CAPSULES (40 MG) BY MOUTH EVERY MORNING. 60 capsule 0  ? amphetamine-dextroamphetamine (ADDERALL XR) 20 MG 24 hr capsule Take 2 capsules (40 mg total) by mouth every morning. 60 capsule 0  ? metoprolol succinate (TOPROL-XL) 50 MG 24 hr tablet TAKE 1 TABLET BY  MOUTH DAILY. TAKE WITH OR IMMEDIATELY FOLLOWING A MEAL. 90 tablet 1  ? amphetamine-dextroamphetamine (ADDERALL) 5 MG tablet TAKE 1 TABLET BY MOUTH DAILY AS NEEDED (IN THE AFTERNOON FOR BREAKTHROUGH SYMPTOMS). 30 tablet 0  ? ?No current facility-administered medications on file prior to visit.  ? ? ?Social History  ? ?Tobacco Use  ? Smoking status: Former  ? Smokeless tobacco: Never  ?Vaping Use  ? Vaping Use: Never used  ?Substance Use Topics  ? Alcohol use: Not Currently  ?  Alcohol/week: 0.0 - 1.0 standard drinks  ?  Comment: glass of wine socially; once per month  ? Drug use: No  ? ? ?Review of Systems  ?Constitutional:  Negative for chills and fever.  ?Respiratory:  Negative for cough.   ?Cardiovascular:  Negative for chest pain and palpitations.  ?Gastrointestinal:  Negative for nausea and vomiting.  ?Genitourinary:  Negative for pelvic pain.  ?   ?Objective:  ?  ?BP 128/76 (BP Location: Left Arm, Patient Position: Sitting, Cuff Size: Normal)   Pulse 76   Temp 98 ?F (36.7 ?C) (Oral)   Ht 5\' 4"  (1.626 m)   Wt 203 lb 6.4 oz (92.3 kg)   LMP 01/28/2022 (Exact Date)   SpO2 98%   BMI 34.91 kg/m?  ?BP Readings from Last 3 Encounters:  ?02/18/22 128/76  ?  01/08/22 (!) 158/110  ?09/03/21 136/90  ? ?Wt Readings from Last 3 Encounters:  ?02/18/22 203 lb 6.4 oz (92.3 kg)  ?09/03/21 199 lb 3.2 oz (90.4 kg)  ?04/01/21 194 lb (88 kg)  ? ? ?Physical Exam ?Vitals reviewed.  ?Constitutional:   ?   Appearance: She is well-developed.  ?Eyes:  ?   Conjunctiva/sclera: Conjunctivae normal.  ?Cardiovascular:  ?   Rate and Rhythm: Normal rate and regular rhythm.  ?   Pulses: Normal pulses.  ?   Heart sounds: Normal heart sounds.  ?Pulmonary:  ?   Effort: Pulmonary effort is normal.  ?   Breath sounds: Normal breath sounds. No wheezing, rhonchi or rales.  ?Skin: ?   General: Skin is warm and dry.  ?Neurological:  ?   Mental Status: She is alert.  ?Psychiatric:     ?   Speech: Speech normal.     ?   Behavior: Behavior normal.     ?    Thought Content: Thought content normal.  ? ? ?   ?Assessment & Plan:  ? ?Problem List Items Addressed This Visit   ? ?  ? Cardiovascular and Mediastinum  ? HTN (hypertension)  ?  Excellent control.  Continue Toprol 50 mg ?  ?  ?  ? Genitourinary  ? Endometrial polyp  ?  Right-sided pelvic pain completely resolved.  Unable to see if red blood cells resulted on urine from 01/08/22  therefore I am repeating urinalysis today; patient will leave at [facility] as she is on her menses today and will wait a couple of weeks prior to collecting.  Also placed referral to GYN, Dr. Valentino Saxon to evaluate patient regarding concern for endometrial polyp and right hemorrhagic ovarian cyst ?  ?  ? Relevant Orders  ? Ambulatory referral to Obstetrics / Gynecology  ? Urinalysis, Routine w reflex microscopic  ? Urine Culture  ?  ? Other  ? Attention deficit hyperactivity disorder (ADHD)  ?  Chronic, stable.  Continue Adderall XR 40 mg on work days; Adderall IR 5 mg in the afternoon as needed ?  ?  ? Relevant Orders  ? POCT HgB A1C (Completed)  ? ? ? ?I have discontinued Makinley M. Beers's ferrous sulfate, cyanocobalamin, and sulfamethoxazole-trimethoprim. I am also having her maintain her metoprolol succinate, amphetamine-dextroamphetamine, amphetamine-dextroamphetamine, amphetamine-dextroamphetamine, and amphetamine-dextroamphetamine. ? ? ?No orders of the defined types were placed in this encounter. ? ? ?Return precautions given.  ? ?Risks, benefits, and alternatives of the medications and treatment plan prescribed today were discussed, and patient expressed understanding.  ? ?Education regarding symptom management and diagnosis given to patient on AVS. ? ?Continue to follow with Allegra Grana, FNP for routine health maintenance.  ? ?Tyrone Nine and I agreed with plan.  ? ?Rennie Plowman, FNP ? ? ?

## 2022-02-19 ENCOUNTER — Other Ambulatory Visit: Payer: Self-pay

## 2022-02-19 DIAGNOSIS — E119 Type 2 diabetes mellitus without complications: Secondary | ICD-10-CM

## 2022-02-19 MED ORDER — METFORMIN HCL ER 500 MG PO TB24
500.0000 mg | ORAL_TABLET | Freq: Every day | ORAL | 3 refills | Status: DC
Start: 2022-02-19 — End: 2022-07-16
  Filled 2022-02-19: qty 30, 30d supply, fill #0

## 2022-03-03 ENCOUNTER — Other Ambulatory Visit: Payer: Self-pay

## 2022-03-03 ENCOUNTER — Other Ambulatory Visit: Payer: Self-pay | Admitting: Family

## 2022-03-03 DIAGNOSIS — F902 Attention-deficit hyperactivity disorder, combined type: Secondary | ICD-10-CM

## 2022-03-04 ENCOUNTER — Other Ambulatory Visit: Payer: Self-pay

## 2022-03-04 ENCOUNTER — Other Ambulatory Visit: Payer: Self-pay | Admitting: Family

## 2022-03-04 DIAGNOSIS — F902 Attention-deficit hyperactivity disorder, combined type: Secondary | ICD-10-CM

## 2022-03-04 MED ORDER — AMPHETAMINE-DEXTROAMPHET ER 20 MG PO CP24
40.0000 mg | ORAL_CAPSULE | Freq: Every morning | ORAL | 0 refills | Status: DC
  Filled 2022-03-04: qty 60, 30d supply, fill #0

## 2022-03-04 MED ORDER — AMPHETAMINE-DEXTROAMPHET ER 20 MG PO CP24
40.0000 mg | ORAL_CAPSULE | ORAL | 0 refills | Status: DC
  Filled 2022-03-04 – 2022-04-10 (×2): qty 60, 30d supply, fill #0

## 2022-03-04 MED ORDER — AMPHETAMINE-DEXTROAMPHET ER 20 MG PO CP24
ORAL_CAPSULE | ORAL | 0 refills | Status: DC
  Filled 2022-03-04: qty 60, fill #0

## 2022-03-04 NOTE — Progress Notes (Signed)
? ? ?GYNECOLOGY PROGRESS NOTE ? ?Subjective:  ? ? Patient ID: Megan Tucker, female    DOB: [birth year], 36 y.o.   MRN: [id number] ? ?HPI ? Patient is a 36 y.o. [id number] female who presents as a referral today from her PCP Rennie Plowman, FNP, [facility]) for several issues.  Patient was recently seen in the ER last month due to pelvic pain and urinary symptoms.  At the time of her evaluation she was noted to have findings of echogenic structure suspicious for endometrial polyp on pelvic ultrasound. ? ?Of note, patient has a history of pelvic pain, dysmenorrhea, and irregular menses.  [facility] and was undergoing the process to have a hysterectomy performed however the pandemic caused her to have to postpone her surgery.  Since that time patient notes that she has had several family health issues and has not been able to manage her own health until now.  ? ? ?GYN History ?Last pap smear ~ 3-4 years ago. Was normal.  ?H/o STIs - Denies ? ? ?Menstrual History: ?OB History   ? ? Gravida  ?3  ? Para  ?3  ? Term  ?3  ? Preterm  ?0  ? AB  ?0  ? Living  ?3  ?  ? ? SAB  ?0  ? IAB  ?0  ? Ectopic  ?0  ? Multiple  ?0  ? Live Births  ?3  ?   ?  ?  ?  ?Menarche age: 39 ?Patient's last menstrual period was 02/26/2022. ?  ? ? ?Past Medical History:  ?Diagnosis Date  ? Anemia   ? Anemia   ? Bacterial vaginitis   ? Gestational diabetes   ? Hypertension   ? ? ?Family History  ?Problem Relation Age of Onset  ? Drug abuse Mother   ?     narcotic addition  ? Stroke Mother   ? Hypertension Mother   ? Mental illness Mother   ? Diabetes Mother   ? Diabetes Father   ? Hypertension Maternal Grandmother   ? Diabetes Maternal Grandmother   ? Diabetes Maternal Grandfather   ? Diabetes Paternal Grandmother   ? Diabetes Paternal Grandfather   ? Other Neg Hx   ? ? ?Past Surgical History:  ?Procedure Laterality Date  ? MOUTH SURGERY    ? TUBAL LIGATION  2014  ? ? ?Social History  ? ?Socioeconomic History  ?  Marital status: Single  ?  Spouse name: Not on file  ? Number of children: Not on file  ? Years of education: Not on file  ? Highest education level: Not on file  ?Occupational History  ? Not on file  ?Tobacco Use  ? Smoking status: Former  ? Smokeless tobacco: Never  ?Vaping Use  ? Vaping Use: Never used  ?Substance and Sexual Activity  ? Alcohol use: Not Currently  ?  Alcohol/week: 0.0 - 1.0 standard drinks  ?  Comment: glass of wine socially; once per month  ? Drug use: No  ? Sexual activity: Yes  ?  Birth control/protection: Surgical  ?Other Topics Concern  ? Not on file  ?Social History Narrative  ? 3 boys  ?   ? Single mom- however father of children are very close, involved.   ?   ? Nurse Tech ICU  ?   ?   ?   ? ?Social Determinants of Health  ? ?Financial Resource Strain: Not on file  ?  Food Insecurity: Not on file  ?Transportation Needs: Not on file  ?Physical Activity: Not on file  ?Stress: Not on file  ?Social Connections: Not on file  ?Intimate Partner Violence: Not on file  ? ? ?Current Outpatient Medications on File Prior to Visit  ?Medication Sig Dispense Refill  ? amphetamine-dextroamphetamine (ADDERALL XR) 20 MG 24 hr capsule Take 2 capsules (40 mg total) by mouth every morning. 60 capsule 0  ? amphetamine-dextroamphetamine (ADDERALL XR) 20 MG 24 hr capsule TAKE 2 CAPSULES (40 MG) BY MOUTH EVERY MORNING. 60 capsule 0  ? amphetamine-dextroamphetamine (ADDERALL XR) 20 MG 24 hr capsule Take 2 capsules (40 mg total) by mouth every morning. 60 capsule 0  ? metFORMIN (GLUCOPHAGE-XR) 500 MG 24 hr tablet Take 1 tablet (500 mg total) by mouth daily with breakfast. 30 tablet 3  ? metoprolol succinate (TOPROL-XL) 50 MG 24 hr tablet TAKE 1 TABLET BY MOUTH DAILY. TAKE WITH OR IMMEDIATELY FOLLOWING A MEAL. 90 tablet 1  ? amphetamine-dextroamphetamine (ADDERALL) 5 MG tablet TAKE 1 TABLET BY MOUTH DAILY AS NEEDED (IN THE AFTERNOON FOR BREAKTHROUGH SYMPTOMS). 30 tablet 0  ? ?No current facility-administered  medications on file prior to visit.  ? ? ?Allergies  ?Allergen Reactions  ? Amlodipine   ?  Rash on legs  ? Adhesive [Tape] Rash  ? ? ? ?Review of Systems ?Pertinent items noted in HPI and remainder of comprehensive ROS otherwise negative.  ? ?Objective:  ? Blood pressure (!) 156/107, pulse 71, resp. rate 16, height 5\' 4"  (1.626 m), weight 201 lb 11.2 oz (91.5 kg), last menstrual period 02/26/2022. Body mass index is 34.62 kg/m?. ?General appearance: alert and no distress ?Abdomen: soft, non-tender; bowel sounds normal; no masses,  no organomegaly ?Pelvic: .external genitalia normal, rectovaginal septum normal.  Vagina without discharge.  Cervix normal appearing, no lesions and no motion tenderness.  Uterus mobile, nontender, normal shape and size.  Adnexae non-palpable, nontender bilaterally.  ?Extremities: extremities normal, atraumatic, no cyanosis or edema ?Neurologic: Grossly normal ? ? ? ?Imaging:  ?US PELVIC COMPLETE W TRANSVAGINAL AND TORSION R/O ?CLINICAL DATA:  Pelvic pain for 10 days ? ?EXAM: ?TRANSABDOMINAL AND TRANSVAGINAL ULTRASOUND OF PELVIS ? ?DOPPLER ULTRASOUND OF OVARIES ? ?TECHNIQUE: ?Both transabdominal and transvaginal ultrasound examinations of the ?pelvis were performed. Transabdominal technique was performed for ?global imaging of the pelvis including uterus, ovaries, adnexal ?regions, and pelvic cul-de-sac. ? ?It was necessary to proceed with endovaginal exam following the ?transabdominal exam to visualize the endometrium and adnexal ?structures. Color and duplex Doppler ultrasound was utilized to ?evaluate blood flow to the ovaries. ? ?COMPARISON:  01/08/2022, 12/28/2018 ? ?FINDINGS: ?Uterus ? ?Measurements: 9.5 x 4.9 x 5.2 cm = volume: 128 mL. No fibroids or ?other mass visualized. ? ?Endometrium ? ?Thickness: 16 mm. 1.2 x 0.5 by 1.1 cm echogenic structure within the ?endometrial cavity consistent with polyp. ? ?Right ovary ? ?Measurements: 4.1 x 2.5 by 3.5 cm = volume: 19.2 mL.  Multiple ?follicles are seen within the right ovary. Likely ruptured follicle ?or hemorrhagic cyst measuring 1.8 x 1.7 x 1.7 cm. ? ?Left ovary ? ?Measurements: 3.7 x 2.2 x 1.5 cm = volume: 6.3 mL. Normal ?appearance/no adnexal mass. ? ?Pulsed Doppler evaluation of both ovaries demonstrates normal ?low-resistance arterial and venous waveforms. ? ?Other findings ? ?Small amount of free fluid within the causes act. ? ?IMPRESSION: ?1. Hemorrhagic cyst right ovary measuring 1.8 cm. ?2. Small amount of free fluid within the lower pelvis. ?3. 1.2 cm echogenic structure within the  endometrial cavity, ?compatible with polyp. Further evaluation with sonohysterogram or ?hysteroscopy could be considered. ? ?Electronically Signed ?  By: Sharlet Salina M.D. ?  On: 01/08/2022 22:07 ?CT Renal Stone Study ?CLINICAL DATA:  Intense low back pain, pain with urination, history ?hypertension ? ?EXAM: ?CT ABDOMEN AND PELVIS WITHOUT CONTRAST ? ?TECHNIQUE: ?Multidetector CT imaging of the abdomen and pelvis was performed ?following the standard protocol without IV contrast. ? ?RADIATION DOSE REDUCTION: This exam was performed according to the ?departmental dose-optimization program which includes automated ?exposure control, adjustment of the mA and/or kV according to ?patient size and/or use of iterative reconstruction technique. ? ?COMPARISON:  None ? ?FINDINGS: ?Lower chest: Lung bases clear ? ?Hepatobiliary: Gallbladder and liver normal appearance ? ?Pancreas: Normal appearance ? ?Spleen: Normal appearance.  Small splenule at splenic hilum. ? ?Adrenals/Urinary Tract: Adrenal glands normal appearance. Smaller ?nodular and scarred appearing RIGHT kidney. Nonobstructing 3 mm ?RIGHT renal calculus. LEFT kidney normal appearance. No ureteral ?calcification, hydronephrosis, or ureteral dilatation. Bladder ?unremarkable. ? ?Stomach/Bowel: Normal appendix. Food debris in stomach. Large and ?small bowel loops normal  appearance. ? ?Vascular/Lymphatic: Aorta normal caliber. Few normal sized ?mesenteric and retroperitoneal lymph nodes. No abdominal or pelvic ?adenopathy. ? ?Reproductive: Unremarkable uterus and LEFT ovary. Enlargement and ?low-attenuation of RIGH

## 2022-03-04 NOTE — Telephone Encounter (Signed)
Can you refuse it is a duplicate.  ?

## 2022-03-05 ENCOUNTER — Encounter: Payer: Self-pay | Admitting: Obstetrics and Gynecology

## 2022-03-05 ENCOUNTER — Other Ambulatory Visit (HOSPITAL_COMMUNITY)
Admission: RE | Admit: 2022-03-05 | Discharge: 2022-03-05 | Disposition: A | Discharge status: Home / Self Care | Payer: 59 | Source: Ambulatory Visit | Attending: Obstetrics and Gynecology | Admitting: Obstetrics and Gynecology

## 2022-03-05 ENCOUNTER — Ambulatory Visit (INDEPENDENT_AMBULATORY_CARE_PROVIDER_SITE_OTHER): Payer: 59 | Admitting: Obstetrics and Gynecology

## 2022-03-05 ENCOUNTER — Other Ambulatory Visit: Payer: Self-pay

## 2022-03-05 VITALS — BP 156/107 | HR 71 | Resp 16 | Ht 64.0 in | Wt 201.7 lb

## 2022-03-05 DIAGNOSIS — Z124 Encounter for screening for malignant neoplasm of cervix: Secondary | ICD-10-CM

## 2022-03-05 DIAGNOSIS — N921 Excessive and frequent menstruation with irregular cycle: Secondary | ICD-10-CM | POA: Diagnosis not present

## 2022-03-05 DIAGNOSIS — I10 Essential (primary) hypertension: Secondary | ICD-10-CM | POA: Diagnosis not present

## 2022-03-05 DIAGNOSIS — N840 Polyp of corpus uteri: Secondary | ICD-10-CM | POA: Diagnosis not present

## 2022-03-05 DIAGNOSIS — N946 Dysmenorrhea, unspecified: Secondary | ICD-10-CM | POA: Diagnosis not present

## 2022-03-05 DIAGNOSIS — R102 Pelvic and perineal pain: Secondary | ICD-10-CM | POA: Insufficient documentation

## 2022-03-05 MED ORDER — NORETHIN ACE-ETH ESTRAD-FE 1-20 MG-MCG(24) PO TABS
1.0000 | ORAL_TABLET | Freq: Every day | ORAL | 0 refills | Status: DC
  Filled 2022-03-05: qty 84, 84d supply, fill #0

## 2022-03-09 LAB — CYTOLOGY - PAP
Comment: NEGATIVE
Diagnosis: NEGATIVE
High risk HPV: NEGATIVE

## 2022-03-10 ENCOUNTER — Other Ambulatory Visit: Payer: Self-pay

## 2022-03-10 ENCOUNTER — Ambulatory Visit: Payer: 59 | Admitting: *Deleted

## 2022-03-10 DIAGNOSIS — N840 Polyp of corpus uteri: Secondary | ICD-10-CM

## 2022-03-17 ENCOUNTER — Encounter: Payer: 59 | Attending: Family | Admitting: *Deleted

## 2022-03-17 ENCOUNTER — Encounter: Payer: Self-pay | Admitting: *Deleted

## 2022-03-17 VITALS — BP 130/82 | Ht 64.0 in | Wt 202.0 lb

## 2022-03-17 DIAGNOSIS — E119 Type 2 diabetes mellitus without complications: Secondary | ICD-10-CM

## 2022-03-17 NOTE — Progress Notes (Signed)
Diabetes Self-Management Education ? ?Visit Type: First/Initial ? ?Appt. Start Time: 0830 Appt. End Time: [id number] ? ?03/17/2022 ? ?[name], identified by name and date of birth, is a 36 y.o. female with a diagnosis of Diabetes: Type 2.  ? ?ASSESSMENT ? ?Blood pressure 130/82, height 5\' 4"  (1.626 m), weight 202 lb (91.6 kg), last menstrual period 02/26/2022. ?Body mass index is 34.67 kg/m?. ? ? Diabetes Self-Management Education - 03/17/22 0948   ? ?  ? Visit Information  ? Visit Type First/Initial   ?  ? Initial Visit  ? Diabetes Type Type 2   ? Date Diagnosed "a month"   ? Are you currently following a meal plan? Yes   ? What type of meal plan do you follow? "no soda, eating less candy/sweets"   ? Are you taking your medications as prescribed? Yes   ?  ? Health Coping  ? How would you rate your overall health? Fair   ?  ? Psychosocial Assessment  ? Patient Belief/Attitude about Diabetes Motivated to manage diabetes   "feel ridiculous"  ? What is the hardest part about your diabetes right now, causing you the most concern, or is the most worrisome to you about your diabetes?   Making healty food and beverage choices   ? Self-care barriers None   ? Self-management support Doctor's office   ? Patient Concerns Nutrition/Meal planning;Glycemic Control;Medication;Weight Control   ? Special Needs None   ? Preferred Learning Style Visual   ? Learning Readiness Change in progress   ? How often do you need to have someone help you when you read instructions, pamphlets, or other written materials from your doctor or pharmacy? 1 - Never   ? What is the last grade level you completed in school? some college   ?  ? Pre-Education Assessment  ? Patient understands the diabetes disease and treatment process. Needs Instruction   ? Patient understands incorporating nutritional management into lifestyle. Needs Instruction   ? Patient undertands incorporating physical activity into lifestyle. Needs Instruction   ? Patient  understands using medications safely. Needs Instruction   ? Patient understands monitoring blood glucose, interpreting and using results Needs Instruction   ? Patient understands prevention, detection, and treatment of acute complications. Needs Instruction   ? Patient understands prevention, detection, and treatment of chronic complications. Needs Instruction   ? Patient understands how to develop strategies to address psychosocial issues. Needs Instruction   ? Patient understands how to develop strategies to promote health/change behavior. Needs Instruction   ?  ? Complications  ? Last HgB A1C per patient/outside source 6.6 %   02/18/2022  ? How often do you check your blood sugar? Patient declines   BG in the office was 119 mg/dL at 9:15 am - 2 1/2 hrs pp.  ? Have you had a dilated eye exam in the past 12 months? No   ? Have you had a dental exam in the past 12 months? No   ? Are you checking your feet? No   ?  ? Dietary Intake  ? Breakfast skips some meals when working when she takes her ADHD medication - eggs and onions with bacon or sausage; occasional oatmeal   ? Snack (morning) "binge eating - especially with candy/chocolate" - granola bar, fruit (watermelon, berries, grapes), candy   ? Lunch grilled chicken salad; cheeseburger with bun   ? Dinner chicken, roast, pork; casseroles, spaghetti, potatoes, occasional bread, green beans, peas, corn, rice, broccoli, zucchini,  salads with cheese cuccumbers, lettuce, sunflower seeds, ranch dressing or no dressing   ? Beverage(s) water, coffee   ?  ? Activity / Exercise  ? Activity / Exercise Type ADL's   ?  ? Patient Education  ? 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 Disease Pathophysiology Definition of diabetes, type 1 and 2, and the diagnosis of diabetes;Factors that contribute to the development of diabetes   ? Healthy Eating Role of diet in the treatment of diabetes and the  relationship between the three main macronutrients and blood glucose level;Food label reading, portion sizes and measuring food.;Meal options for control of blood glucose level and chronic complications.   ? Being Active Role of exercise on diabetes management, blood pressure control and cardiac health.   ? Medications Reviewed patients medication for diabetes, action, purpose, timing of dose and side effects.   ? Monitoring Purpose and frequency of SMBG.;Identified appropriate SMBG and/or A1C goals.;Yearly dilated eye exam   ? Chronic complications Relationship between chronic complications and blood glucose control   ? Diabetes Stress and Support Identified and addressed patients feelings and concerns about diabetes   ?  ? Individualized Goals (developed by patient)  ? Reducing Risk Other (comment)   improve blood sugars, decreae medications, lose weight  ?  ? Outcomes  ? Expected Outcomes Demonstrated interest in learning. Expect positive outcomes   ? ?  ?  ? ?Individualized Plan for Diabetes Self-Management Training:  ? ?Learning Objective:  Patient will have a greater understanding of diabetes self-management. ?Patient education plan is to attend individual and/or group sessions per assessed needs and concerns. ?  ?Plan:  ? ?Patient Instructions  ?Exercise: Begin walking  for    15  minutes   3  days a week and gradually increase to 30 minutes 5 x week ? ?Eat 3 meals day,   1-2  snacks a day ?Space meals 4-6 hours apart ?Allow 2-3 hours between meals and snacks ?Don't skip meals - eat at least 1 protein and 1 carbohydrate serving ?Eat 1 protein serving when having fruit for a snack ?Limit desserts/sweets ?Continue to avoid sugar sweetened drinks (soda) ? ?Make an eye doctor appointment ? ?Expected Outcomes:  Demonstrated interest in learning. Expect positive outcomes ? ?Education material provided:  ?Metallurgist Guidelines ?Simple Meal Plan ?Healthy Snack Choices (ADA) ? ?If problems or questions,  patient to contact team via:  Johny Drilling, RN, [place], [place] [phone] ? ?Future DSME appointment:  ?She will check her schedule and call back if she can schedule classes.  ?

## 2022-03-17 NOTE — Patient Instructions (Signed)
Exercise: Begin walking  for    15  minutes   3  days a week and gradually increase to 30 minutes 5 x week ? ?Eat 3 meals day,   1-2  snacks a day ?Space meals 4-6 hours apart ?Allow 2-3 hours between meals and snacks ?Don't skip meals - eat at least 1 protein and 1 carbohydrate serving ?Eat 1 protein serving when having fruit for a snack ?Limit desserts/sweets ?Continue to avoid sugar sweetened drinks (soda) ? ?Make an eye doctor appointment ? ?Return for classes on:    ?

## 2022-04-10 ENCOUNTER — Other Ambulatory Visit: Payer: Self-pay | Admitting: Family

## 2022-04-10 ENCOUNTER — Other Ambulatory Visit: Payer: Self-pay

## 2022-04-10 DIAGNOSIS — I10 Essential (primary) hypertension: Secondary | ICD-10-CM

## 2022-04-10 MED FILL — Metoprolol Succinate Tab ER 24HR 50 MG (Tartrate Equiv): ORAL | 90 days supply | Qty: 90 | Fill #0 | Status: AC

## 2022-04-16 ENCOUNTER — Encounter: Payer: Self-pay | Admitting: *Deleted

## 2022-04-28 ENCOUNTER — Ambulatory Visit: Payer: 59 | Admitting: *Deleted

## 2022-05-12 ENCOUNTER — Other Ambulatory Visit: Payer: Self-pay

## 2022-05-12 ENCOUNTER — Telehealth: Payer: 59 | Admitting: Physician Assistant

## 2022-05-12 DIAGNOSIS — H60331 Swimmer's ear, right ear: Secondary | ICD-10-CM

## 2022-05-12 MED ORDER — NEOMYCIN-POLYMYXIN-HC 3.5-10000-1 OT SOLN
4.0000 [drp] | Freq: Four times a day (QID) | OTIC | 0 refills | Status: AC
  Filled 2022-05-12: qty 10, 13d supply, fill #0

## 2022-05-12 NOTE — Progress Notes (Signed)
E Visit for Swimmer's Ear  We are sorry that you are not feeling well. Here is how we plan to help!  Based on what you have shared with me it looks like you have swimmers ear. Swimmer's ear is a redness or swelling, irritation, or infection of your outer ear canal.  These symptoms usually occur within a few days of swimming.  Your ear canal is a tube that goes from the opening of the ear to the eardrum.  When water stays in your ear canal, germs can grow.  This is a painful condition that often happens to children and swimmers of all ages.  It is not contagious and oral antibiotics are not required to treat uncomplicated swimmer's ear.  The usual symptoms include: Itching inside the ear, Redness or a sense of swelling in the ear, Pain when the ear is tugged on when pressure is placed on the ear, Pus draining from the infected ear. and I have prescribed: Neomycin 0.35%, polymyxin B 10,000 units/mL, and hydrocortisone 0,5% otic solution 4 drops in affected ears four times a day for 7 days  In certain cases swimmer's ear may progress to a more serious bacterial infection of the middle or inner ear.  If you have a fever 102 and up and significantly worsening symptoms, this could indicate a more serious infection moving to the middle/inner and needs face to face evaluation in an office by a provider.  Your symptoms should improve over the next 3 days and should resolve in about 7 days.  HOME CARE:  Wash your hands frequently. Do not place the tip of the bottle on your ear or touch it with your fingers. You can take Acetominophen 650 mg every 4-6 hours as needed for pain.  If pain is severe or moderate, you can apply a heating pad (set on low) or hot water bottle (wrapped in a towel) to outer ear for 20 minutes.  This will also increase drainage. Avoid ear plugs Do not use Q-tips After showers, help the water run out by tilting your head to one side.  GET HELP RIGHT AWAY IF:  Fever is over 102.2  degrees. You develop progressive ear pain or hearing loss. Ear symptoms persist longer than 3 days after treatment.  MAKE SURE YOU:  Understand these instructions. Will watch your condition. Will get help right away if you are not doing well or get worse.  TO PREVENT SWIMMER'S EAR: Use a bathing cap or custom fitted swim molds to keep your ears dry. Towel off after swimming to dry your ears. Tilt your head or pull your earlobes to allow the water to escape your ear canal. If there is still water in your ears, consider using a hairdryer on the lowest setting.   Thank you for choosing an e-visit.  Your e-visit answers were reviewed by a board certified advanced clinical practitioner to complete your personal care plan. Depending upon the condition, your plan could have included both over the counter or prescription medications.  Please review your pharmacy choice. Make sure the pharmacy is open so you can pick up prescription now. If there is a problem, you may contact your provider through MyChart messaging and have the prescription routed to another pharmacy.  Your safety is important to us. If you have drug allergies check your prescription carefully.   For the next 24 hours you can use MyChart to ask questions about today's visit, request a non-urgent call back, or ask for a work or   school excuse. You will get an email in the next two days asking about your experience. I hope that your e-visit has been valuable and will speed your recovery.    

## 2022-05-12 NOTE — Progress Notes (Signed)
I have spent 5 minutes in review of e-visit questionnaire, review and updating patient chart, medical decision making and response to patient.   Piers Baade Cody Brandon Scarbrough, PA-C    

## 2022-05-19 ENCOUNTER — Ambulatory Visit: Payer: 59 | Admitting: Family

## 2022-06-03 ENCOUNTER — Ambulatory Visit: Payer: 59 | Admitting: Family

## 2022-06-04 ENCOUNTER — Encounter: Payer: 59 | Admitting: Obstetrics and Gynecology

## 2022-06-05 ENCOUNTER — Encounter: Payer: Self-pay | Admitting: Family

## 2022-06-08 ENCOUNTER — Encounter: Payer: Self-pay | Admitting: Family

## 2022-06-08 ENCOUNTER — Other Ambulatory Visit: Payer: Self-pay

## 2022-06-08 ENCOUNTER — Other Ambulatory Visit: Payer: Self-pay | Admitting: Family

## 2022-06-08 DIAGNOSIS — E139 Other specified diabetes mellitus without complications: Secondary | ICD-10-CM

## 2022-06-08 MED ORDER — OZEMPIC (0.25 OR 0.5 MG/DOSE) 2 MG/3ML ~~LOC~~ SOPN
0.2500 mg | PEN_INJECTOR | SUBCUTANEOUS | 3 refills | Status: DC
  Filled 2022-06-08: qty 3, 28d supply, fill #0

## 2022-06-08 NOTE — Progress Notes (Signed)
Sent ozempic

## 2022-06-10 ENCOUNTER — Encounter: Payer: 59 | Admitting: Obstetrics and Gynecology

## 2022-06-23 ENCOUNTER — Encounter: Payer: 59 | Admitting: Obstetrics and Gynecology

## 2022-06-29 ENCOUNTER — Other Ambulatory Visit: Payer: Self-pay | Admitting: Family

## 2022-06-29 DIAGNOSIS — F902 Attention-deficit hyperactivity disorder, combined type: Secondary | ICD-10-CM

## 2022-06-30 ENCOUNTER — Other Ambulatory Visit: Payer: Self-pay | Admitting: Family

## 2022-06-30 ENCOUNTER — Other Ambulatory Visit: Payer: Self-pay

## 2022-06-30 DIAGNOSIS — F902 Attention-deficit hyperactivity disorder, combined type: Secondary | ICD-10-CM

## 2022-06-30 MED ORDER — AMPHETAMINE-DEXTROAMPHET ER 20 MG PO CP24
40.0000 mg | ORAL_CAPSULE | ORAL | 0 refills | Status: DC
  Filled 2022-06-30: qty 60, 30d supply, fill #0

## 2022-06-30 MED ORDER — AMPHETAMINE-DEXTROAMPHET ER 20 MG PO CP24
40.0000 mg | ORAL_CAPSULE | ORAL | 0 refills | Status: DC
  Filled 2022-06-30 – 2022-08-01 (×2): qty 60, 30d supply, fill #0

## 2022-06-30 NOTE — Progress Notes (Signed)
I looked up patient on [place] Controlled Substances Reporting System PMP AWARE and saw no activity that raised concern of inappropriate use.   

## 2022-07-06 NOTE — Progress Notes (Deleted)
    GYNECOLOGY PROGRESS NOTE  Subjective:    Patient ID: Megan Tucker, female    DOB: [birth year], 36 y.o.   MRN: [id number]  HPI  Patient is a 36 y.o. [id number] female who presents for follow up on menorrhagia and possible pre op.  {Common ambulatory SmartLinks:19316}  Review of Systems {ros; complete:30496}   Objective:   There were no vitals taken for this visit. There is no height or weight on file to calculate BMI. General appearance: {general exam:16600} Abdomen: {abdominal exam:16834} Pelvic: {pelvic exam:16852::"cervix normal in appearance","external genitalia normal","no adnexal masses or tenderness","no cervical motion tenderness","rectovaginal septum normal","uterus normal size, shape, and consistency","vagina normal without discharge"} Extremities: {extremity exam:5109} Neurologic: {neuro exam:17854}   Assessment:   No diagnosis found.   Plan:   There are no diagnoses linked to this encounter.     Hildred Laser, MD [facility]

## 2022-07-08 ENCOUNTER — Encounter: Payer: 59 | Admitting: Obstetrics and Gynecology

## 2022-07-16 ENCOUNTER — Encounter: Payer: Self-pay | Admitting: Family

## 2022-07-16 ENCOUNTER — Other Ambulatory Visit: Payer: Self-pay

## 2022-07-16 ENCOUNTER — Ambulatory Visit: Payer: 59 | Admitting: Family

## 2022-07-16 VITALS — BP 124/80 | HR 80 | Temp 98.2°F | Ht 64.0 in | Wt 196.2 lb

## 2022-07-16 DIAGNOSIS — R5383 Other fatigue: Secondary | ICD-10-CM

## 2022-07-16 DIAGNOSIS — E139 Other specified diabetes mellitus without complications: Secondary | ICD-10-CM | POA: Diagnosis not present

## 2022-07-16 DIAGNOSIS — R7303 Prediabetes: Secondary | ICD-10-CM | POA: Diagnosis not present

## 2022-07-16 DIAGNOSIS — F902 Attention-deficit hyperactivity disorder, combined type: Secondary | ICD-10-CM

## 2022-07-16 DIAGNOSIS — I10 Essential (primary) hypertension: Secondary | ICD-10-CM | POA: Diagnosis not present

## 2022-07-16 LAB — MICROALBUMIN / CREATININE URINE RATIO
Creatinine,U: 137.7 mg/dL
Microalb Creat Ratio: 1.6 mg/g (ref 0.0–30.0)
Microalb, Ur: 2.1 mg/dL — ABNORMAL HIGH (ref 0.0–1.9)

## 2022-07-16 LAB — POCT GLYCOSYLATED HEMOGLOBIN (HGB A1C): Hemoglobin A1C: 5.8 % — AB (ref 4.0–5.6)

## 2022-07-16 MED ORDER — OZEMPIC (0.25 OR 0.5 MG/DOSE) 2 MG/3ML ~~LOC~~ SOPN
0.5000 mg | PEN_INJECTOR | SUBCUTANEOUS | 3 refills | Status: DC
  Filled 2022-07-16: qty 3, 28d supply, fill #0
  Filled 2022-08-16: qty 3, 28d supply, fill #1

## 2022-07-16 NOTE — Assessment & Plan Note (Signed)
Chronic, stable.  Continue Adderall XR 40 mg every morning.  No side effects at this time.  will monitor

## 2022-07-16 NOTE — Assessment & Plan Note (Signed)
Chronic, stable.  Continue Toprol 50 mg 

## 2022-07-16 NOTE — Assessment & Plan Note (Signed)
Lab Results  Component Value Date   HGBA1C 5.8 (A) 07/16/2022   Excellent control.  Patient is no longer on metformin.  She is pleased with Ozempic 0.5 mg would like to continue for maintenance.  I have refilled today

## 2022-07-16 NOTE — Progress Notes (Signed)
Subjective:    Patient ID: Megan Tucker, female    DOB: [birth year], 36 y.o.   MRN: [id number]  CC: Megan Tucker is a 36 y.o. female who presents today for follow up.   HPI: Feels well today.  No new complaints   HTN-compliant with Toprol 50 mg.  She is quite pleased with blood pressure  ADHD-compliant with Adderall XR 40 mg every morning . No increased anxiety, trouble sleeping.   DM-compliant with Ozempic 0.5 mg weekly . She is tolerating medication. She is no longer on metformin.   endometrial polyp, menorrhagia ,considering hysterectomy with Dr Valentino Saxon HISTORY:  Past Medical History:  Diagnosis Date   Anemia    Anemia    Bacterial vaginitis    Gestational diabetes    Hypertension    Past Surgical History:  Procedure Laterality Date   MOUTH SURGERY     TUBAL LIGATION  2014   Family History  Problem Relation Age of Onset   Drug abuse Mother        narcotic addition   Stroke Mother    Hypertension Mother    Mental illness Mother    Diabetes Mother    Diabetes Father    Hypertension Maternal Grandmother    Diabetes Maternal Grandmother    Diabetes Maternal Grandfather    Other Neg Hx    Thyroid cancer Neg Hx     Allergies: Amlodipine and Adhesive [tape] Current Outpatient Medications on File Prior to Visit  Medication Sig Dispense Refill   amphetamine-dextroamphetamine (ADDERALL XR) 20 MG 24 hr capsule Take 2 capsules (40 mg total) by mouth every morning. 60 capsule 0   amphetamine-dextroamphetamine (ADDERALL XR) 20 MG 24 hr capsule Take 2 capsules (40 mg total) by mouth every morning. 60 capsule 0   metoprolol succinate (TOPROL-XL) 50 MG 24 hr tablet TAKE 1 TABLET BY MOUTH DAILY. TAKE WITH OR IMMEDIATELY FOLLOWING A MEAL. 90 tablet 1   No current facility-administered medications on file prior to visit.    Social History   Tobacco Use   Smoking status: Former    Packs/day: 2.00    Years: 12.00    Total pack years: 24.00    Types: Cigarettes    Quit  date: 11/23/2013    Years since quitting: 8.6   Smokeless tobacco: Never  Vaping Use   Vaping Use: Never used  Substance Use Topics   Alcohol use: Not Currently    Alcohol/week: 0.0 - 1.0 standard drinks of alcohol    Comment: glass of wine socially; once per month   Drug use: No    Review of Systems  Constitutional:  Negative for chills and fever.  Respiratory:  Negative for cough.   Cardiovascular:  Negative for chest pain and palpitations.  Gastrointestinal:  Negative for nausea and vomiting.  Psychiatric/Behavioral:  Negative for sleep disturbance. The patient is not nervous/anxious.       Objective:    BP 124/80 (BP Location: Left Arm, Patient Position: Sitting, Cuff Size: Normal)   Pulse 80   Temp 98.2 F (36.8 C) (Oral)   Ht 5\' 4"  (1.626 m)   Wt 196 lb 3.2 oz (89 kg)   LMP 07/01/2022 (Exact Date)   SpO2 98%   BMI 33.68 kg/m  BP Readings from Last 3 Encounters:  07/16/22 124/80  03/17/22 130/82  03/05/22 (!) 156/107   Wt Readings from Last 3 Encounters:  07/16/22 196 lb 3.2 oz (89 kg)  03/17/22 202 lb (91.6 kg)  03/05/22  201 lb 11.2 oz (91.5 kg)    Physical Exam Vitals reviewed.  Constitutional:      Appearance: She is well-developed.  Eyes:     Conjunctiva/sclera: Conjunctivae normal.  Cardiovascular:     Rate and Rhythm: Normal rate and regular rhythm.     Pulses: Normal pulses.     Heart sounds: Normal heart sounds.  Pulmonary:     Effort: Pulmonary effort is normal.     Breath sounds: Normal breath sounds. No wheezing, rhonchi or rales.  Skin:    General: Skin is warm and dry.  Neurological:     Mental Status: She is alert.  Psychiatric:        Speech: Speech normal.        Behavior: Behavior normal.        Thought Content: Thought content normal.        Assessment & Plan:   Problem List Items Addressed This Visit       Cardiovascular and Mediastinum   HTN (hypertension) - Primary    Chronic, stable.  Continue Toprol 50 mg       Relevant Orders   POCT HgB A1C (Completed)     Other   Attention deficit hyperactivity disorder (ADHD)    Chronic, stable.  Continue Adderall XR 40 mg every morning.  No side effects at this time.  will monitor      Fatigue   Relevant Orders   Urine Microalbumin w/creat. ratio   Prediabetes    Lab Results  Component Value Date   HGBA1C 5.8 (A) 07/16/2022  Excellent control.  Patient is no longer on metformin.  She is pleased with Ozempic 0.5 mg would like to continue for maintenance.  I have refilled today      Other Visit Diagnoses     Other specified diabetes mellitus without complication, without long-term current use of insulin (HCC)       Relevant Medications   Semaglutide,0.25 or 0.5MG /DOS, (OZEMPIC, 0.25 OR 0.5 MG/DOSE,) 2 MG/3ML SOPN        I have discontinued Yentl M. Amores's metFORMIN and Norethindrone Acetate-Ethinyl Estrad-FE. I have also changed her Ozempic (0.25 or 0.5 MG/DOSE). Additionally, I am having her maintain her metoprolol succinate, amphetamine-dextroamphetamine, and amphetamine-dextroamphetamine.   Meds ordered this encounter  Medications   Semaglutide,0.25 or 0.5MG /DOS, (OZEMPIC, 0.25 OR 0.5 MG/DOSE,) 2 MG/3ML SOPN    Sig: Inject 0.5 mg into the skin once a week.    Dispense:  3 mL    Refill:  3    Order Specific Question:   Supervising Provider    Answer:   Sherlene Shams [[id number]]    Return precautions given.   Risks, benefits, and alternatives of the medications and treatment plan prescribed today were discussed, and patient expressed understanding.   Education regarding symptom management and diagnosis given to patient on AVS.  Continue to follow with Allegra Grana, FNP for routine health maintenance.   Tyrone Nine and I agreed with plan.   Rennie Plowman, FNP

## 2022-07-17 ENCOUNTER — Ambulatory Visit: Payer: 59 | Admitting: Family

## 2022-07-20 ENCOUNTER — Other Ambulatory Visit: Payer: Self-pay | Admitting: Family

## 2022-07-24 ENCOUNTER — Encounter: Payer: Self-pay | Admitting: Family

## 2022-07-26 NOTE — Telephone Encounter (Signed)
See response to this result note that has been closed.  Hi Tyrone Nine,    There is evidence of protein in your urine, most likely from hypertension, prediabetes/diabetes.  Ideally I would place you on a small dose of losartan to protect your kidneys.  Would you like me to repeat this urine study in a couple months now that A1c has decreased or would you like for me to add on a small dose of losartan 12.5mg ?   Take care,  Claris Che, NP

## 2022-07-28 ENCOUNTER — Other Ambulatory Visit: Payer: Self-pay | Admitting: Family

## 2022-07-28 ENCOUNTER — Other Ambulatory Visit: Payer: Self-pay

## 2022-07-28 DIAGNOSIS — R809 Proteinuria, unspecified: Secondary | ICD-10-CM

## 2022-07-28 MED ORDER — LOSARTAN POTASSIUM 25 MG PO TABS
12.5000 mg | ORAL_TABLET | Freq: Every day | ORAL | 1 refills | Status: DC
  Filled 2022-07-28: qty 45, 90d supply, fill #0
  Filled 2022-10-18: qty 45, 90d supply, fill #1
  Filled 2023-01-12: qty 45, 90d supply, fill #2
  Filled 2023-04-26: qty 45, 90d supply, fill #3

## 2022-08-02 ENCOUNTER — Other Ambulatory Visit: Payer: Self-pay

## 2022-08-03 ENCOUNTER — Other Ambulatory Visit: Payer: Self-pay

## 2022-08-04 ENCOUNTER — Other Ambulatory Visit: Payer: Self-pay

## 2022-08-04 MED FILL — Metoprolol Succinate Tab ER 24HR 50 MG (Tartrate Equiv): ORAL | 90 days supply | Qty: 90 | Fill #1 | Status: AC

## 2022-08-16 ENCOUNTER — Other Ambulatory Visit: Payer: Self-pay

## 2022-08-17 ENCOUNTER — Other Ambulatory Visit: Payer: Self-pay

## 2022-08-31 ENCOUNTER — Other Ambulatory Visit: Payer: Self-pay | Admitting: Family

## 2022-08-31 DIAGNOSIS — F902 Attention-deficit hyperactivity disorder, combined type: Secondary | ICD-10-CM

## 2022-08-31 NOTE — Telephone Encounter (Signed)
Refilled: 06/30/2022 Last OV: 07/16/2022 Next OV: 11/25/2022

## 2022-09-01 ENCOUNTER — Other Ambulatory Visit: Payer: Self-pay | Admitting: Family

## 2022-09-01 ENCOUNTER — Other Ambulatory Visit: Payer: Self-pay

## 2022-09-01 DIAGNOSIS — F902 Attention-deficit hyperactivity disorder, combined type: Secondary | ICD-10-CM

## 2022-09-01 MED ORDER — AMPHETAMINE-DEXTROAMPHET ER 20 MG PO CP24
40.0000 mg | ORAL_CAPSULE | ORAL | 0 refills | Status: DC
  Filled 2022-09-01 – 2022-11-05 (×2): qty 60, 30d supply, fill #0

## 2022-09-01 MED ORDER — AMPHETAMINE-DEXTROAMPHET ER 20 MG PO CP24
40.0000 mg | ORAL_CAPSULE | ORAL | 0 refills | Status: DC
  Filled 2022-09-01: qty 60, 30d supply, fill #0

## 2022-09-01 MED ORDER — AMPHETAMINE-DEXTROAMPHET ER 20 MG PO CP24
40.0000 mg | ORAL_CAPSULE | ORAL | 0 refills | Status: DC
  Filled 2022-09-01 – 2022-10-02 (×2): qty 30, 15d supply, fill #0

## 2022-09-19 ENCOUNTER — Encounter: Payer: Self-pay | Admitting: Family

## 2022-09-21 ENCOUNTER — Other Ambulatory Visit: Payer: Self-pay | Admitting: Family

## 2022-09-21 ENCOUNTER — Other Ambulatory Visit: Payer: Self-pay

## 2022-09-21 DIAGNOSIS — E669 Obesity, unspecified: Secondary | ICD-10-CM

## 2022-09-21 MED ORDER — SEMAGLUTIDE (1 MG/DOSE) 4 MG/3ML ~~LOC~~ SOPN
1.0000 mg | PEN_INJECTOR | SUBCUTANEOUS | 2 refills | Status: DC
  Filled 2022-09-21: qty 3, 28d supply, fill #0
  Filled 2022-10-18: qty 3, 28d supply, fill #1
  Filled 2022-11-18: qty 3, 28d supply, fill #2

## 2022-10-02 ENCOUNTER — Other Ambulatory Visit: Payer: Self-pay

## 2022-10-19 ENCOUNTER — Other Ambulatory Visit: Payer: Self-pay

## 2022-11-04 ENCOUNTER — Other Ambulatory Visit: Payer: Self-pay | Admitting: Family

## 2022-11-04 DIAGNOSIS — F902 Attention-deficit hyperactivity disorder, combined type: Secondary | ICD-10-CM

## 2022-11-05 ENCOUNTER — Other Ambulatory Visit: Payer: Self-pay

## 2022-11-18 ENCOUNTER — Other Ambulatory Visit: Payer: Self-pay

## 2022-11-18 ENCOUNTER — Other Ambulatory Visit: Payer: Self-pay | Admitting: Family

## 2022-11-18 DIAGNOSIS — I10 Essential (primary) hypertension: Secondary | ICD-10-CM

## 2022-11-18 MED ORDER — METOPROLOL SUCCINATE ER 50 MG PO TB24
50.0000 mg | ORAL_TABLET | Freq: Every day | ORAL | 1 refills | Status: DC
  Filled 2022-11-18: qty 90, 90d supply, fill #0
  Filled 2023-01-12 – 2023-02-15 (×2): qty 90, 90d supply, fill #1

## 2022-11-25 ENCOUNTER — Ambulatory Visit: Payer: 59 | Admitting: Family

## 2022-12-11 ENCOUNTER — Ambulatory Visit: Payer: 59 | Admitting: Family

## 2022-12-14 ENCOUNTER — Other Ambulatory Visit: Payer: Self-pay

## 2022-12-14 ENCOUNTER — Ambulatory Visit: Payer: Commercial Managed Care - PPO | Admitting: Family

## 2022-12-14 ENCOUNTER — Other Ambulatory Visit: Payer: Self-pay | Admitting: Family

## 2022-12-14 ENCOUNTER — Encounter: Payer: Self-pay | Admitting: Family

## 2022-12-14 VITALS — BP 122/82 | HR 96 | Temp 97.9°F | Ht 64.0 in | Wt 171.6 lb

## 2022-12-14 DIAGNOSIS — Z8639 Personal history of other endocrine, nutritional and metabolic disease: Secondary | ICD-10-CM | POA: Diagnosis not present

## 2022-12-14 DIAGNOSIS — I10 Essential (primary) hypertension: Secondary | ICD-10-CM | POA: Diagnosis not present

## 2022-12-14 DIAGNOSIS — E669 Obesity, unspecified: Secondary | ICD-10-CM

## 2022-12-14 DIAGNOSIS — F902 Attention-deficit hyperactivity disorder, combined type: Secondary | ICD-10-CM

## 2022-12-14 DIAGNOSIS — R7303 Prediabetes: Secondary | ICD-10-CM | POA: Diagnosis not present

## 2022-12-14 LAB — COMPREHENSIVE METABOLIC PANEL
ALT: 11 U/L (ref 0–35)
AST: 12 U/L (ref 0–37)
Albumin: 4.4 g/dL (ref 3.5–5.2)
Alkaline Phosphatase: 49 U/L (ref 39–117)
BUN: 13 mg/dL (ref 6–23)
CO2: 28 mEq/L (ref 19–32)
Calcium: 9.8 mg/dL (ref 8.4–10.5)
Chloride: 104 mEq/L (ref 96–112)
Creatinine, Ser: 0.82 mg/dL (ref 0.40–1.20)
GFR: 91.41 mL/min (ref >60.00)
Glucose, Bld: 83 mg/dL (ref 70–99)
Potassium: 5 mEq/L (ref 3.5–5.1)
Sodium: 138 mEq/L (ref 135–145)
Total Bilirubin: 0.5 mg/dL (ref 0.2–1.2)
Total Protein: 7.1 g/dL (ref 6.0–8.3)

## 2022-12-14 LAB — LIPID PANEL
Cholesterol: 158 mg/dL (ref 0–200)
HDL: 36.3 mg/dL — ABNORMAL LOW (ref >39.00)
LDL Cholesterol: 110 mg/dL — ABNORMAL HIGH (ref 0–99)
NonHDL: 121.28
Total CHOL/HDL Ratio: 4
Triglycerides: 57 mg/dL (ref 0.0–149.0)
VLDL: 11.4 mg/dL (ref 0.0–40.0)

## 2022-12-14 LAB — HEMOGLOBIN A1C: Hgb A1c MFr Bld: 5.6 % (ref 4.6–6.5)

## 2022-12-14 MED ORDER — SEMAGLUTIDE (1 MG/DOSE) 4 MG/3ML ~~LOC~~ SOPN
1.0000 mg | PEN_INJECTOR | SUBCUTANEOUS | 3 refills | Status: DC
  Filled 2022-12-14: qty 3, 28d supply, fill #0
  Filled 2023-01-12: qty 3, 28d supply, fill #1
  Filled 2023-02-07: qty 3, 28d supply, fill #2
  Filled 2023-03-12 – 2023-03-15 (×2): qty 3, 28d supply, fill #3

## 2022-12-14 MED ORDER — AMPHETAMINE-DEXTROAMPHET ER 20 MG PO CP24
40.0000 mg | ORAL_CAPSULE | ORAL | 0 refills | Status: DC
  Filled 2022-12-14: qty 60, 30d supply, fill #0

## 2022-12-14 MED ORDER — AMPHETAMINE-DEXTROAMPHETAMINE 5 MG PO TABS
5.0000 mg | ORAL_TABLET | Freq: Every day | ORAL | 0 refills | Status: DC
  Filled 2022-12-14 – 2023-02-15 (×4): qty 30, 30d supply, fill #0

## 2022-12-14 MED ORDER — AMPHETAMINE-DEXTROAMPHET ER 20 MG PO CP24
40.0000 mg | ORAL_CAPSULE | ORAL | 0 refills | Status: DC
  Filled 2022-12-14 – 2023-02-15 (×3): qty 30, 15d supply, fill #0

## 2022-12-14 MED ORDER — AMPHETAMINE-DEXTROAMPHETAMINE 5 MG PO TABS
5.0000 mg | ORAL_TABLET | Freq: Every day | ORAL | 0 refills | Status: DC
  Filled 2022-12-14 – 2023-01-15 (×2): qty 30, 30d supply, fill #0

## 2022-12-14 MED ORDER — AMPHETAMINE-DEXTROAMPHETAMINE 5 MG PO TABS
5.0000 mg | ORAL_TABLET | Freq: Every day | ORAL | 0 refills | Status: DC
  Filled 2022-12-14: qty 30, 30d supply, fill #0

## 2022-12-14 MED ORDER — AMPHETAMINE-DEXTROAMPHET ER 20 MG PO CP24
40.0000 mg | ORAL_CAPSULE | ORAL | 0 refills | Status: DC
  Filled 2022-12-14 – 2023-01-12 (×2): qty 60, 30d supply, fill #0

## 2022-12-14 NOTE — Progress Notes (Signed)
Assessment & Plan:  Hypertension, unspecified type Assessment & Plan: Chronic, stable.  Continue metoprolol succinate 50 mg daily, losartan 12.5 mg daily.    Orders: -     Hemoglobin A1c -     Lipid panel -     Comprehensive metabolic panel  Attention deficit hyperactivity disorder (ADHD), combined type Assessment & Plan: Suboptimal control. Add adderall IR 5mg  to use daily or as needed. Refilled adderall XR 40mg  qd.  I looked up patient on [place] Controlled Substances Reporting System PMP AWARE and saw no activity that raised concern of inappropriate use.    Orders: -     Amphetamine-Dextroamphet ER; Take 2 capsules (40 mg total) by mouth every morning.  Dispense: 30 capsule; Refill: 0 -     Amphetamine-Dextroamphet ER; Take 2 capsules (40 mg total) by mouth every morning.  Dispense: 60 capsule; Refill: 0 -     Amphetamine-Dextroamphet ER; Take 2 capsules (40 mg total) by mouth every morning.  Dispense: 60 capsule; Refill: 0 -     Amphetamine-Dextroamphetamine; Take 1 tablet (5 mg total) by mouth daily.  Dispense: 30 tablet; Refill: 0 -     Amphetamine-Dextroamphetamine; Take 1 tablet (5 mg total) by mouth daily.  Dispense: 30 tablet; Refill: 0 -     Amphetamine-Dextroamphetamine; Take 1 tablet (5 mg total) by mouth daily.  Dispense: 30 tablet; Refill: 0  Obesity without serious comorbidity, unspecified classification, unspecified obesity type -     Semaglutide (1 MG/DOSE); Inject 1 mg as directed once a week.  Dispense: 3 mL; Refill: 3  History of diabetes mellitus -     Semaglutide (1 MG/DOSE); Inject 1 mg as directed once a week.  Dispense: 3 mL; Refill: 3  Prediabetes Assessment & Plan: H/o DM.  Patient has done excellent on Ozempic.  Will continue Ozempic 1 mg      Return precautions given.   Risks, benefits, and alternatives of the medications and treatment plan prescribed today were discussed, and patient expressed understanding.   Education regarding symptom  management and diagnosis given to patient on AVS either electronically or printed.  No follow-ups on file.  [name], FNP  Subjective:    Patient ID: Megan Tucker, female    DOB: 04-Oct-1986, 37 y.o.   MRN: [id number]  CC: Megan Tucker is a 37 y.o. female who presents today for follow up.   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 She notices at 2pm that adderall 40mg  XR starts to wane.  No increased anxiety, palpations.     Hypertension-compliant with metoprolol succinate 50 mg daily, losartan 12.5 mg daily.  She feels regimen is working well for her.  She is pleased with weight loss on Ozempic 1mg .  Tolerating medication well.  Allergies: Amlodipine and Adhesive [tape] Current Outpatient Medications on File Prior to Visit  Medication Sig Dispense Refill   losartan (COZAAR) 25 MG tablet Take 0.5 tablets (12.5 mg total) by mouth daily. 90 tablet 1   metoprolol succinate (TOPROL-XL) 50 MG 24 hr tablet Take 1 tablet (50 mg total) by mouth daily take with or immediately following a meal. 90 tablet 1   No current facility-administered medications on file prior to visit.    Review of Systems  Constitutional:  Negative for chills and fever.  Respiratory:  Negative for cough.  Cardiovascular:  Negative for chest pain and palpitations.  Gastrointestinal:  Negative for nausea and vomiting.      Objective:    BP 122/82   Pulse 96   Temp 97.9 F (36.6 C) (Oral)   Ht 5\' 4"  (1.626 m)   Wt 171 lb 9.6 oz (77.8 kg)   SpO2 99%   BMI 29.46 kg/m  BP Readings from Last 3 Encounters:  12/14/22 122/82  07/16/22 124/80  03/17/22 130/82   Wt Readings from Last 3 Encounters:  12/14/22 171 lb 9.6 oz (77.8 kg)  07/16/22 196 lb 3.2 oz (89 kg)  03/17/22 202 lb (91.6 kg)    Physical Exam Vitals reviewed.  Constitutional:      Appearance: She is  well-developed.  Eyes:     Conjunctiva/sclera: Conjunctivae normal.  Cardiovascular:     Rate and Rhythm: Normal rate and regular rhythm.     Pulses: Normal pulses.     Heart sounds: Normal heart sounds.  Pulmonary:     Effort: Pulmonary effort is normal.     Breath sounds: Normal breath sounds. No wheezing, rhonchi or rales.  Skin:    General: Skin is warm and dry.  Neurological:     Mental Status: She is alert.  Psychiatric:        Speech: Speech normal.        Behavior: Behavior normal.        Thought Content: Thought content normal.

## 2022-12-14 NOTE — Assessment & Plan Note (Signed)
H/o DM.  Patient has done excellent on Ozempic.  Will continue Ozempic 1 mg

## 2022-12-14 NOTE — Assessment & Plan Note (Signed)
Suboptimal control. Add adderall IR 5mg  to use daily or as needed. Refilled adderall XR 40mg  qd.  I looked up patient on [place] Controlled Substances Reporting System PMP AWARE and saw no activity that raised concern of inappropriate use.

## 2022-12-14 NOTE — Assessment & Plan Note (Signed)
Chronic, stable.  Continue metoprolol succinate 50 mg daily, losartan 12.5 mg daily.

## 2023-01-13 ENCOUNTER — Other Ambulatory Visit: Payer: Self-pay

## 2023-01-15 ENCOUNTER — Other Ambulatory Visit: Payer: Self-pay

## 2023-02-04 IMAGING — US US PELVIS COMPLETE TRANSABD/TRANSVAG W DUPLEX AND/OR DOPPLER
1 series · 13 of 25 positions shown · non-contrast
Comparison: 01/08/2022, 12/28/2018

CLINICAL DATA: Pelvic pain for 10 days

EXAM:
TRANSABDOMINAL AND TRANSVAGINAL ULTRASOUND OF PELVIS
DOPPLER ULTRASOUND OF OVARIES
TECHNIQUE: Both transabdominal and transvaginal ultrasound examinations of the
pelvis were performed. Transabdominal technique was performed for
global imaging of the pelvis including uterus, ovaries, adnexal
regions, and pelvic cul-de-sac.
It was necessary to proceed with endovaginal exam following the
transabdominal exam to visualize the endometrium and adnexal
structures. Color and duplex Doppler ultrasound was utilized to
evaluate blood flow to the ovaries.

[Series 1: us pelvic complete w transvaginal and torsion righ · 13 of 79 slices shown]
[im 1/79]
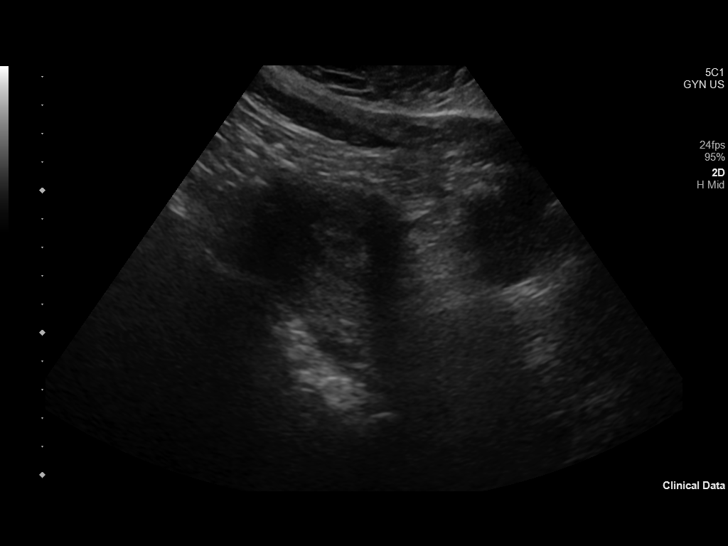
[im 7/79]
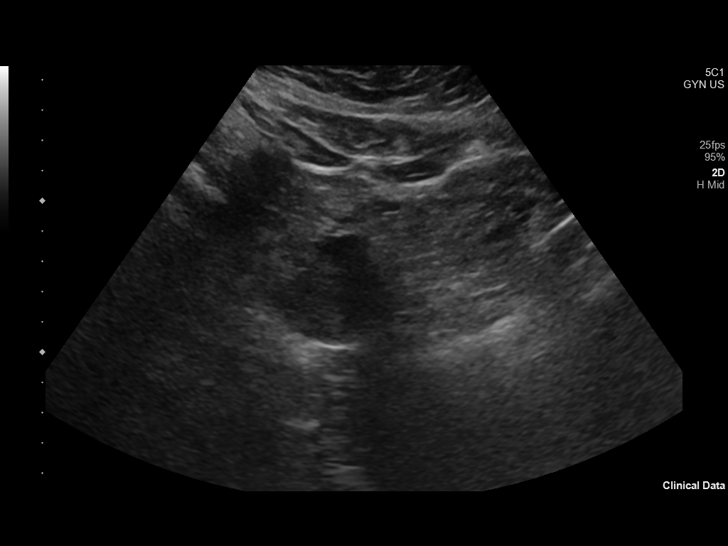
[im 14/79]
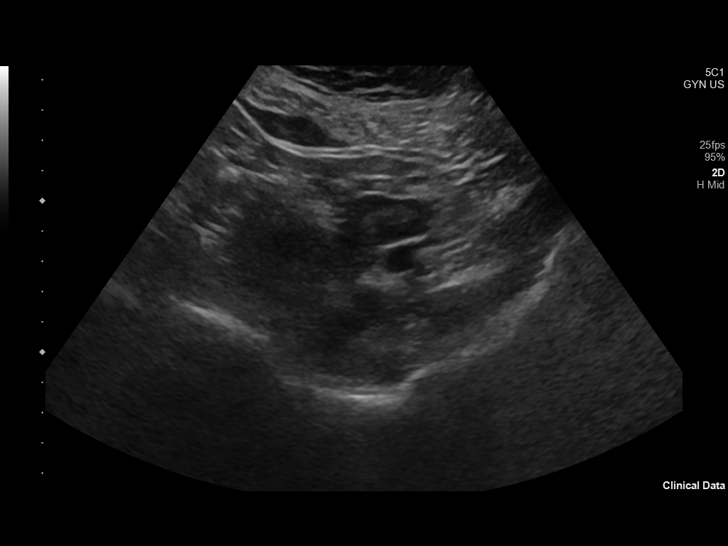
[im 20/79]
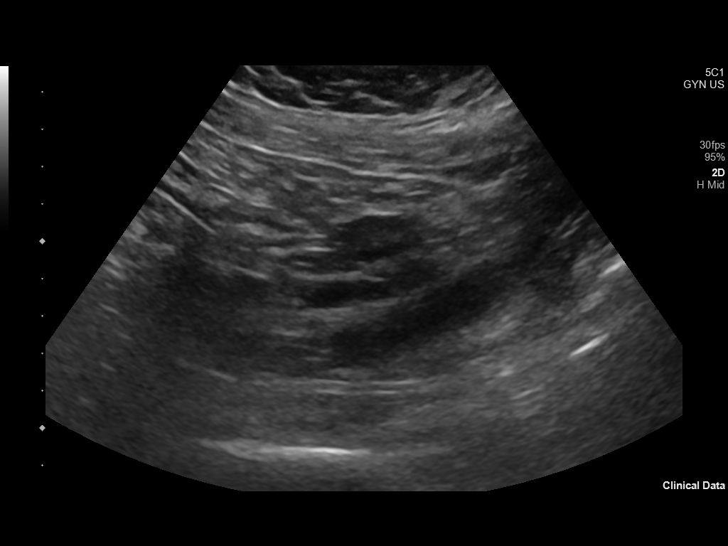
[im 27/79]
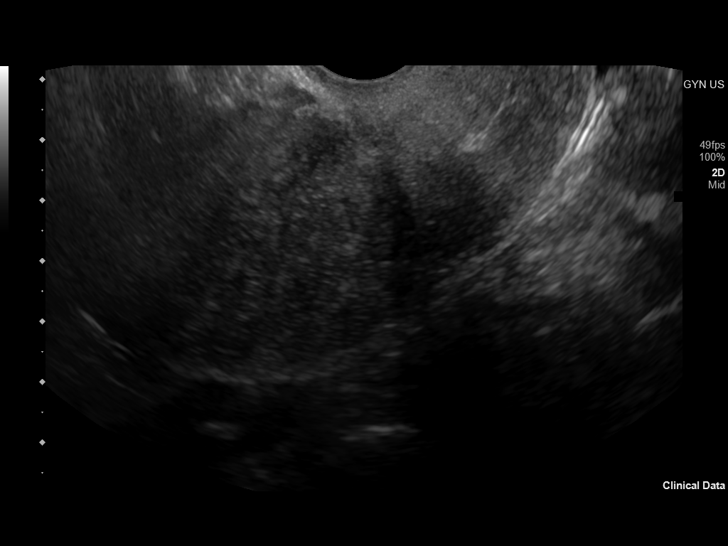
[im 33/79]
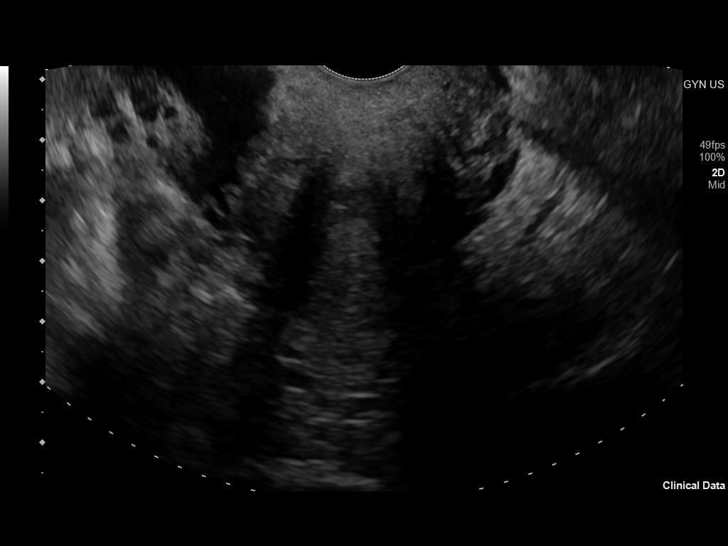
[im 40/79]
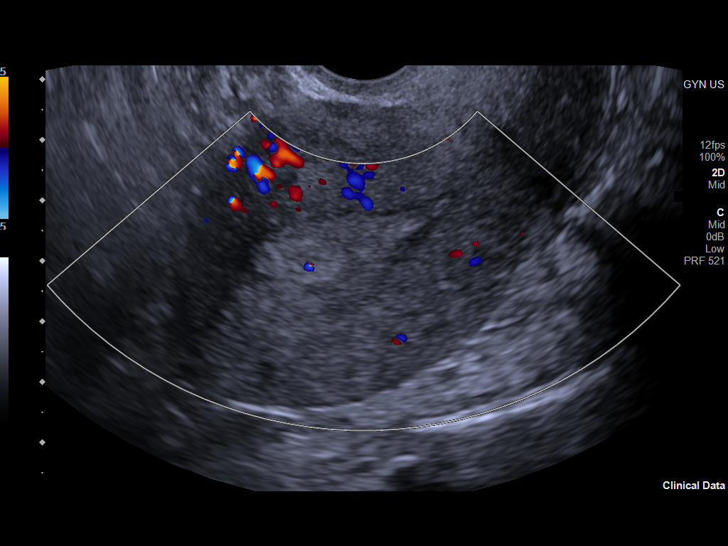
[im 46/79]
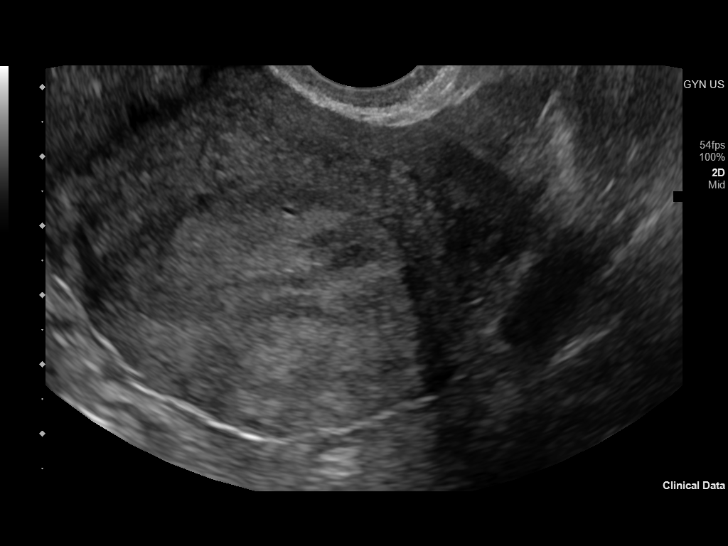
[im 53/79]
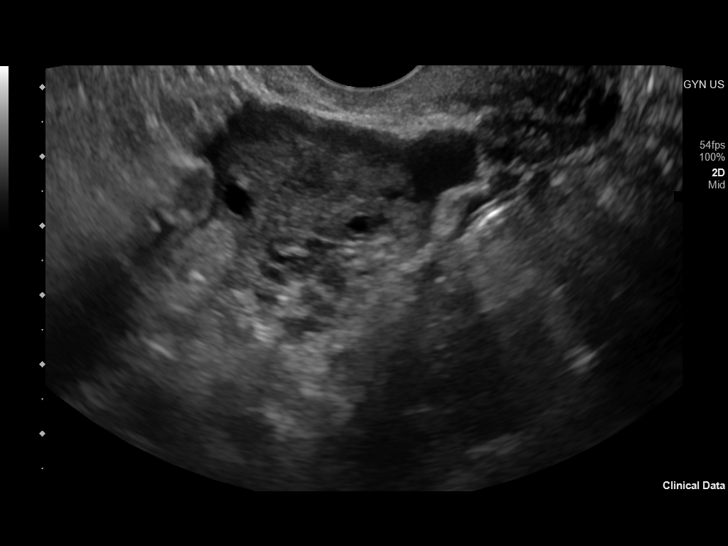
[im 59/79]
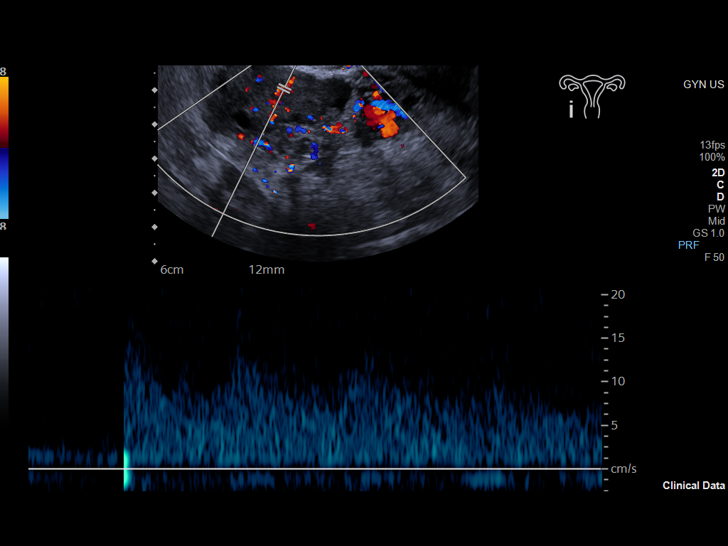
[im 66/79]
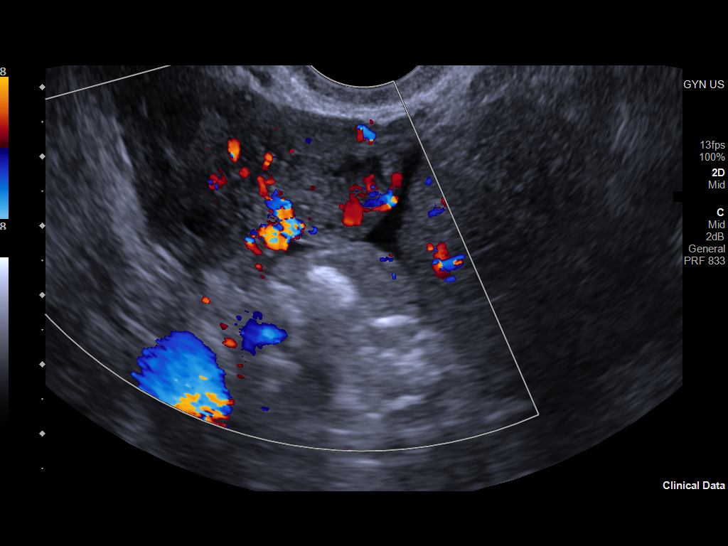
[im 72/79]
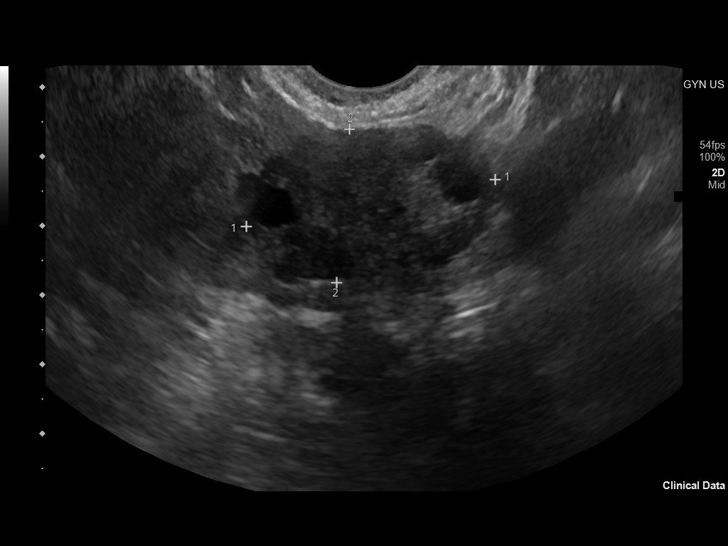
[im 79/79]
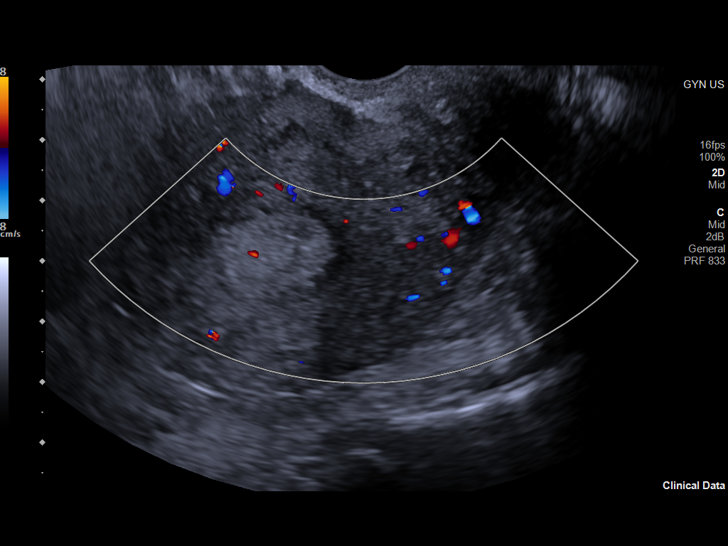

[13 of 25 positions shown; findings below may reference images not displayed]

FINDINGS: Uterus

Measurements: 9.5 x 4.9 x 5.2 cm = volume: 128 mL. No fibroids or
other mass visualized.

Endometrium

Thickness: 16 mm. 1.2 x 0.5 by 1.1 cm echogenic structure within the
endometrial cavity consistent with polyp.

Right ovary

Measurements: 4.1 x 2.5 by 3.5 cm = volume: 19.2 mL. Multiple
follicles are seen within the right ovary. Likely ruptured follicle
or hemorrhagic cyst measuring 1.8 x 1.7 x 1.7 cm.

Left ovary

Measurements: 3.7 x 2.2 x 1.5 cm = volume: 6.3 mL. Normal
appearance/no adnexal mass.

Pulsed Doppler evaluation of both ovaries demonstrates normal
low-resistance arterial and venous waveforms.

Other findings

Small amount of free fluid within the causes act.
IMPRESSION: 1. Hemorrhagic cyst right ovary measuring 1.8 cm.
2. Small amount of free fluid within the lower pelvis.
[DATE] cm echogenic structure within the endometrial cavity,
compatible with polyp. Further evaluation with sonohysterogram or
hysteroscopy could be considered.

## 2023-02-04 IMAGING — CT CT RENAL STONE PROTOCOL
2 of 5 series · 16 of 46 positions shown, 18 images · non-contrast
Comparison: None

CLINICAL DATA: Intense low back pain, pain with urination, history
hypertension



[Series 2: stone full standard · axial · 0.78mm/px · z∈[-1088,-648]mm · 13 of 96 slices shown, 15 images]
[im 4/96  soft-tissue]
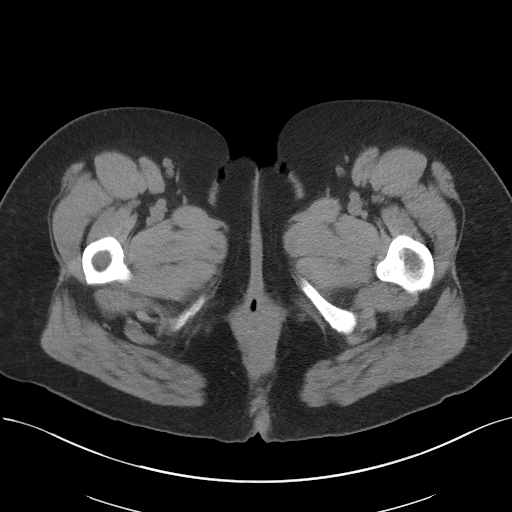
[im 4/96  bone]
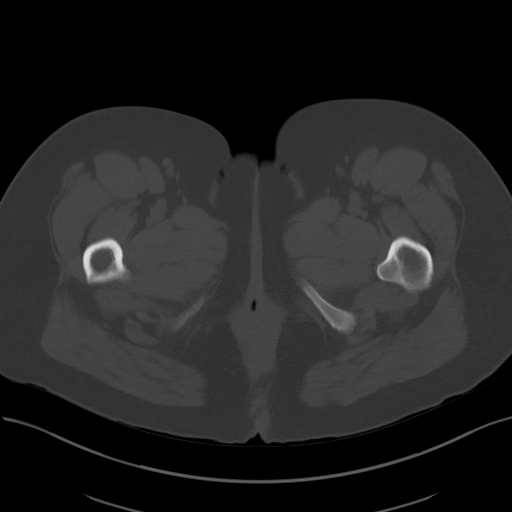
[im 12/96  soft-tissue]
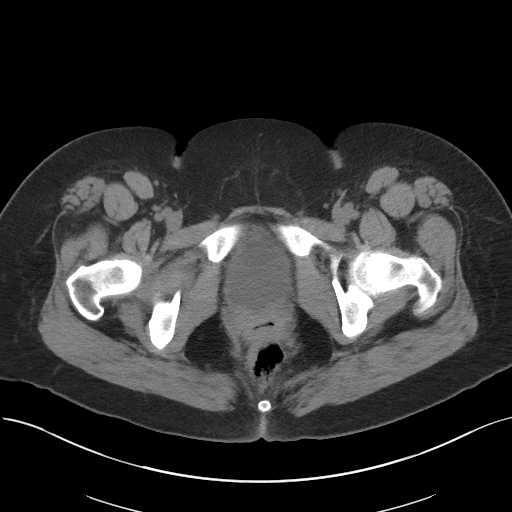
[im 20/96  soft-tissue]
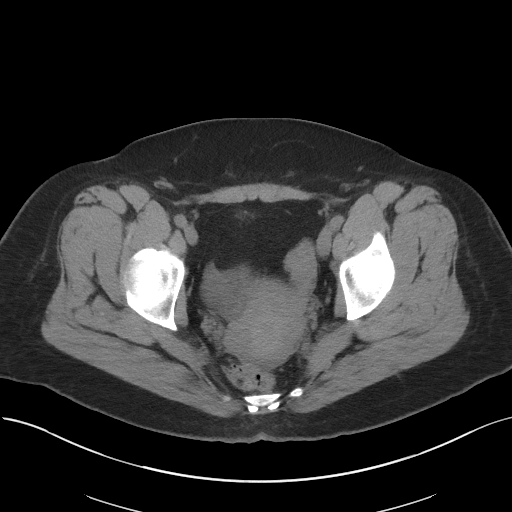
[im 28/96  soft-tissue]
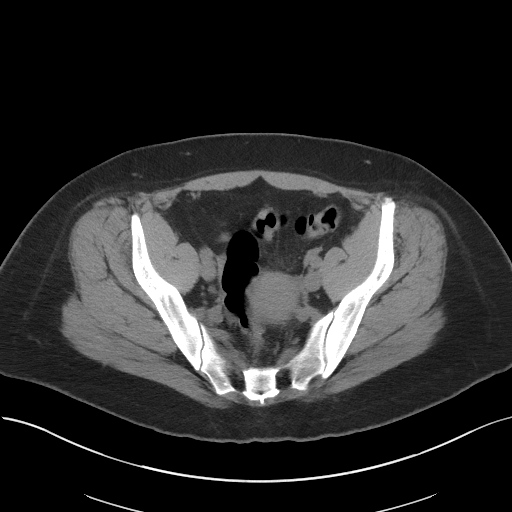
[im 32/96  soft-tissue]
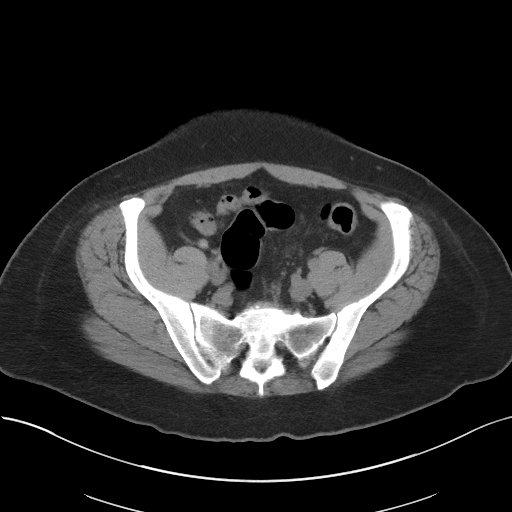
[im 40/96  soft-tissue]
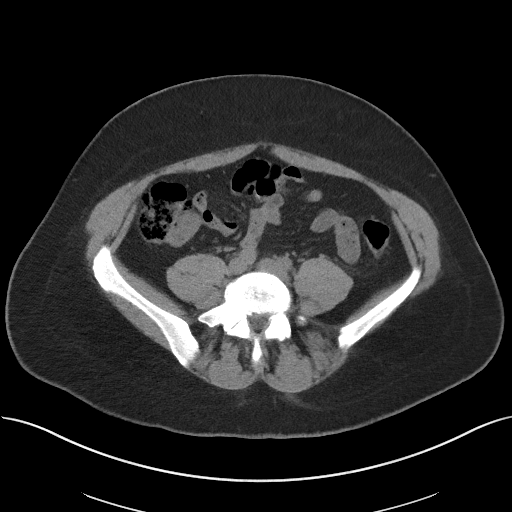
[im 48/96  soft-tissue]
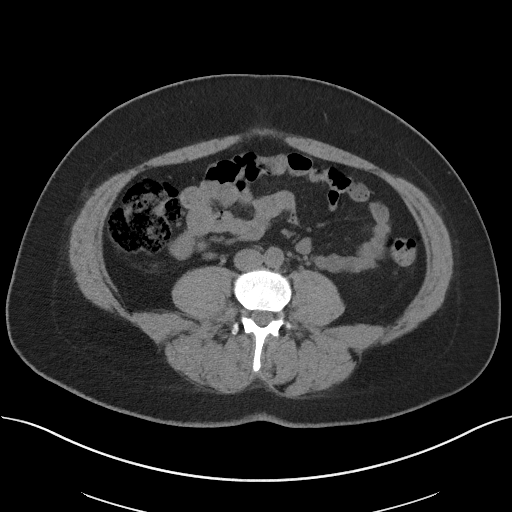
[im 56/96  soft-tissue]
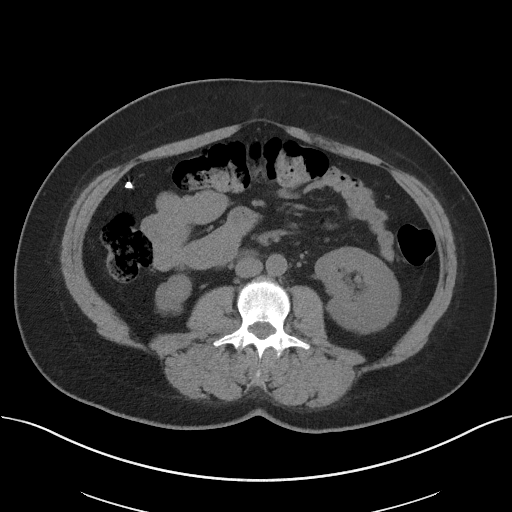
[im 64/96  soft-tissue]
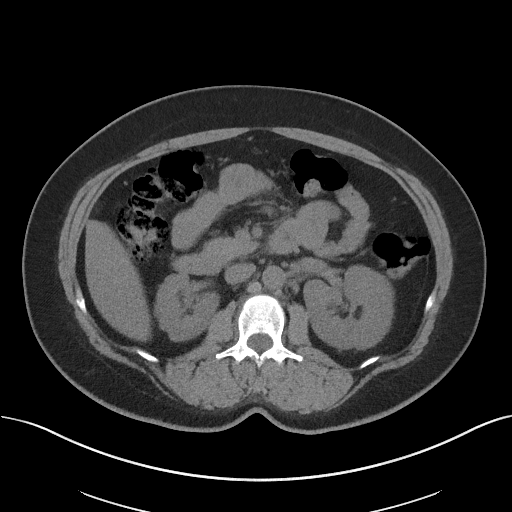
[im 64/96  bone]
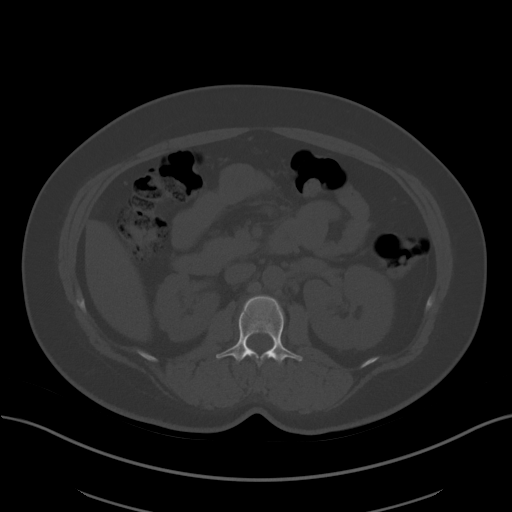
[im 68/96  soft-tissue]
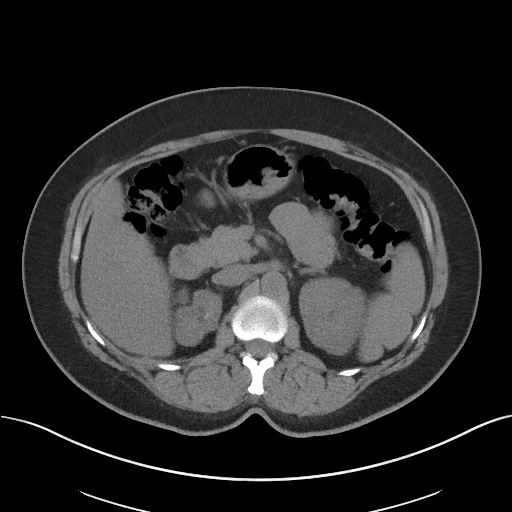
[im 76/96  soft-tissue]
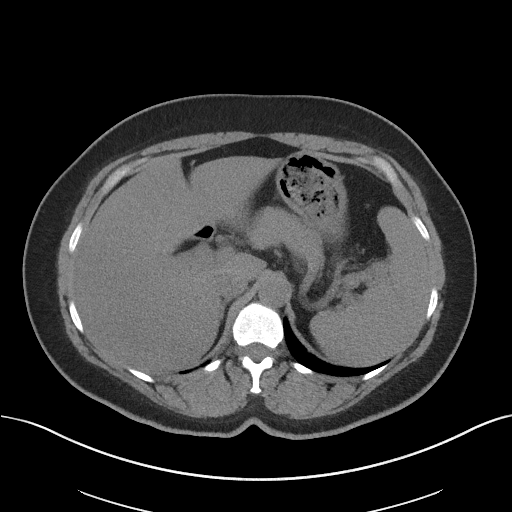
[im 84/96  soft-tissue]
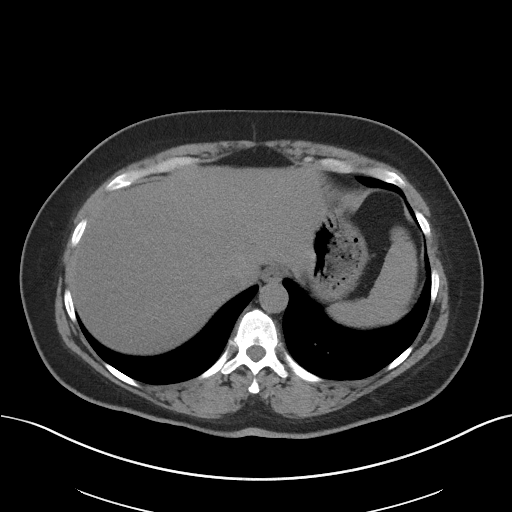
[im 92/96  soft-tissue]
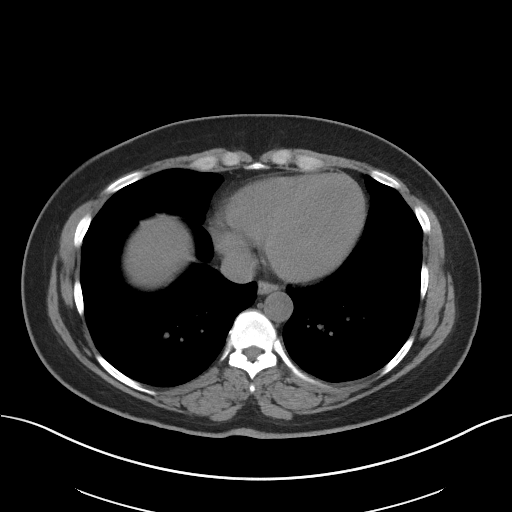

[Series 5: coronal · coronal · 0.74mm/px · 3 of 146 slices shown]
[im 37/146  soft-tissue]
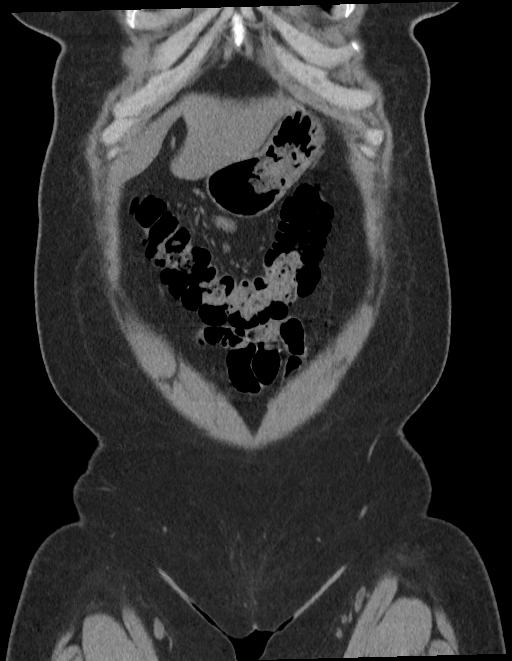
[im 73/146  soft-tissue]
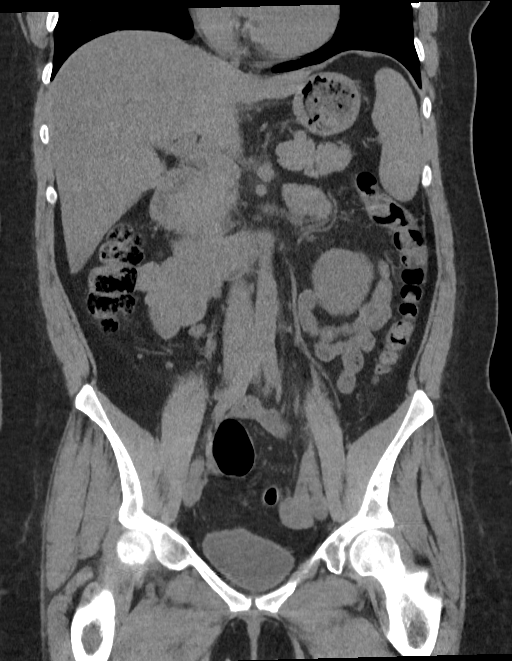
[im 109/146  soft-tissue]
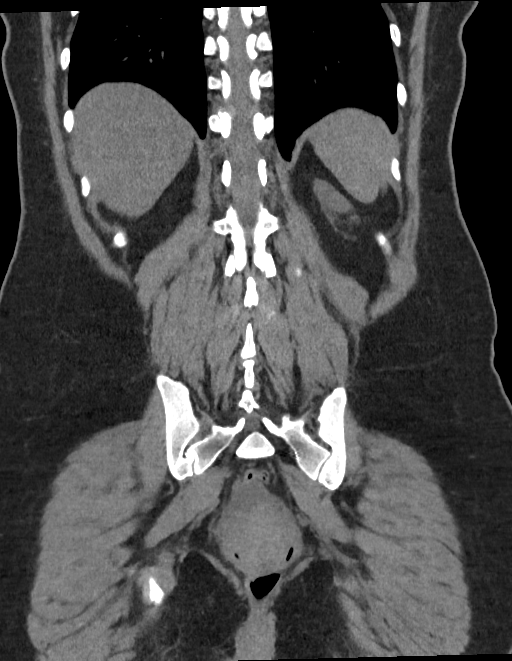

[16 of 46 positions shown; findings below may reference images not displayed]

FINDINGS: Lower chest: Lung bases clear

Hepatobiliary: Gallbladder and liver normal appearance

Pancreas: Normal appearance

Spleen: Normal appearance.  Small splenule at splenic hilum.

Adrenals/Urinary Tract: Adrenal glands normal appearance. Smaller
nodular and scarred appearing RIGHT kidney. Nonobstructing 3 mm
RIGHT renal calculus. LEFT kidney normal appearance. No ureteral
calcification, hydronephrosis, or ureteral dilatation. Bladder
unremarkable.

Stomach/Bowel: Normal appendix. Food debris in stomach. Large and
small bowel loops normal appearance.

Vascular/Lymphatic: Aorta normal caliber. Few normal sized
mesenteric and retroperitoneal lymph nodes. No abdominal or pelvic
adenopathy.

Reproductive: Unremarkable uterus and LEFT ovary. Enlargement and
low-attenuation of RIGHT ovary versus LEFT, 4.7 cm greatest
diameter.

Other: Low-attenuation free pelvic fluid in cul-de-sac. No free air.
No hernia.

Musculoskeletal: Osseous structures unremarkable.
IMPRESSION: Nonobstructing 3 mm RIGHT renal calculus.

Enlargement and low-attenuation of RIGHT ovary versus LEFT, with
small amount of nonspecific free pelvic fluid, may potentially
represent a ruptured RIGHT ovarian cyst; may consider follow-up
pelvic sonography if clinically indicated.

Remainder of exam unremarkable.

## 2023-02-07 ENCOUNTER — Other Ambulatory Visit: Payer: Self-pay

## 2023-02-08 ENCOUNTER — Other Ambulatory Visit: Payer: Self-pay

## 2023-02-10 ENCOUNTER — Other Ambulatory Visit: Payer: Self-pay

## 2023-02-15 ENCOUNTER — Other Ambulatory Visit: Payer: Self-pay

## 2023-02-18 ENCOUNTER — Other Ambulatory Visit: Payer: Self-pay

## 2023-03-12 ENCOUNTER — Other Ambulatory Visit: Payer: Self-pay

## 2023-03-15 ENCOUNTER — Other Ambulatory Visit: Payer: Self-pay

## 2023-03-15 ENCOUNTER — Other Ambulatory Visit: Payer: Self-pay | Admitting: Family

## 2023-03-15 ENCOUNTER — Encounter: Payer: Self-pay | Admitting: Family

## 2023-03-15 DIAGNOSIS — F902 Attention-deficit hyperactivity disorder, combined type: Secondary | ICD-10-CM

## 2023-03-15 MED ORDER — AMPHETAMINE-DEXTROAMPHET ER 20 MG PO CP24
40.0000 mg | ORAL_CAPSULE | ORAL | 0 refills | Status: DC
  Filled 2023-03-15 – 2023-04-26 (×2): qty 30, 15d supply, fill #0

## 2023-03-15 MED ORDER — AMPHETAMINE-DEXTROAMPHET ER 20 MG PO CP24
40.0000 mg | ORAL_CAPSULE | ORAL | 0 refills | Status: DC
  Filled 2023-03-15 – 2023-05-31 (×2): qty 60, 30d supply, fill #0

## 2023-03-15 MED ORDER — AMPHETAMINE-DEXTROAMPHET ER 20 MG PO CP24
40.0000 mg | ORAL_CAPSULE | ORAL | 0 refills | Status: DC
  Filled 2023-03-15 – 2023-03-19 (×4): qty 60, 30d supply, fill #0

## 2023-03-16 ENCOUNTER — Other Ambulatory Visit: Payer: Self-pay

## 2023-03-17 ENCOUNTER — Other Ambulatory Visit: Payer: Self-pay

## 2023-03-17 ENCOUNTER — Other Ambulatory Visit: Payer: Self-pay | Admitting: Family

## 2023-03-17 DIAGNOSIS — F902 Attention-deficit hyperactivity disorder, combined type: Secondary | ICD-10-CM

## 2023-03-18 ENCOUNTER — Other Ambulatory Visit (HOSPITAL_COMMUNITY): Payer: Self-pay

## 2023-03-18 ENCOUNTER — Other Ambulatory Visit: Payer: Self-pay

## 2023-03-18 ENCOUNTER — Other Ambulatory Visit: Payer: Self-pay | Admitting: Family

## 2023-03-18 DIAGNOSIS — F902 Attention-deficit hyperactivity disorder, combined type: Secondary | ICD-10-CM

## 2023-03-19 ENCOUNTER — Other Ambulatory Visit: Payer: Self-pay | Admitting: Family

## 2023-03-19 ENCOUNTER — Other Ambulatory Visit: Payer: Self-pay

## 2023-03-19 DIAGNOSIS — F902 Attention-deficit hyperactivity disorder, combined type: Secondary | ICD-10-CM

## 2023-03-19 MED ORDER — AMPHETAMINE-DEXTROAMPHETAMINE 5 MG PO TABS
5.0000 mg | ORAL_TABLET | Freq: Every day | ORAL | 0 refills | Status: DC
  Filled 2023-03-19 – 2023-05-31 (×2): qty 30, 30d supply, fill #0

## 2023-03-19 MED ORDER — AMPHETAMINE-DEXTROAMPHETAMINE 5 MG PO TABS
5.0000 mg | ORAL_TABLET | Freq: Every day | ORAL | 0 refills | Status: DC
  Filled 2023-03-19 – 2023-04-26 (×2): qty 30, 30d supply, fill #0

## 2023-03-19 MED ORDER — AMPHETAMINE-DEXTROAMPHETAMINE 5 MG PO TABS
5.0000 mg | ORAL_TABLET | Freq: Every day | ORAL | 0 refills | Status: DC
  Filled 2023-03-19: qty 30, 30d supply, fill #0

## 2023-04-02 ENCOUNTER — Other Ambulatory Visit: Payer: Self-pay

## 2023-04-26 ENCOUNTER — Other Ambulatory Visit: Payer: Self-pay

## 2023-04-29 ENCOUNTER — Encounter: Payer: Self-pay | Admitting: Family

## 2023-05-12 ENCOUNTER — Other Ambulatory Visit (HOSPITAL_BASED_OUTPATIENT_CLINIC_OR_DEPARTMENT_OTHER): Payer: Self-pay

## 2023-06-01 ENCOUNTER — Other Ambulatory Visit: Payer: Self-pay

## 2023-06-10 ENCOUNTER — Encounter: Payer: Self-pay | Admitting: Family

## 2023-06-11 NOTE — Telephone Encounter (Signed)
Noted  

## 2023-06-23 ENCOUNTER — Other Ambulatory Visit: Payer: Self-pay | Admitting: Family

## 2023-06-23 ENCOUNTER — Other Ambulatory Visit: Payer: Self-pay

## 2023-06-23 ENCOUNTER — Ambulatory Visit: Payer: Commercial Managed Care - PPO | Admitting: Family

## 2023-06-23 VITALS — BP 122/86 | HR 86 | Temp 98.6°F | Ht 64.0 in | Wt 180.6 lb

## 2023-06-23 DIAGNOSIS — I10 Essential (primary) hypertension: Secondary | ICD-10-CM

## 2023-06-23 DIAGNOSIS — B351 Tinea unguium: Secondary | ICD-10-CM

## 2023-06-23 DIAGNOSIS — R809 Proteinuria, unspecified: Secondary | ICD-10-CM | POA: Diagnosis not present

## 2023-06-23 DIAGNOSIS — F4321 Adjustment disorder with depressed mood: Secondary | ICD-10-CM

## 2023-06-23 MED ORDER — LOSARTAN POTASSIUM 25 MG PO TABS
25.0000 mg | ORAL_TABLET | Freq: Every day | ORAL | 3 refills | Status: DC
  Filled 2023-06-23 – 2023-07-18 (×3): qty 90, 90d supply, fill #0
  Filled 2023-10-20 – 2023-11-09 (×2): qty 90, 90d supply, fill #1
  Filled 2024-02-09: qty 90, 90d supply, fill #2
  Filled 2024-04-24: qty 90, 90d supply, fill #3

## 2023-06-23 MED ORDER — HYDROXYZINE HCL 10 MG PO TABS: 10.0000 mg | ORAL_TABLET | Freq: Two times a day (BID) | ORAL | Status: DC | PRN

## 2023-06-23 MED ORDER — TERBINAFINE HCL 250 MG PO TABS
ORAL_TABLET | ORAL | 0 refills | Status: DC
  Filled 2023-06-23: qty 56, 84d supply, fill #0

## 2023-06-23 NOTE — Progress Notes (Unsigned)
Assessment & Plan:  Onychomycosis Assessment & Plan: presentation consistent with onychomycosis.  Start terbinafine.  Recheck liver enzymes 6 weeks.  Orders: -     Terbinafine HCl; Take 1 tablet once daily for 4 weeks, off for 4 weeks, then resume with 1 tablet once daily for 4 weeks, 12 wks treatment  Dispense: 56 tablet; Refill: 0 -     Comprehensive metabolic panel; Future  Proteinuria, unspecified type -     Losartan Potassium; Take 1 tablet (25 mg total) by mouth daily.  Dispense: 90 tablet; Refill: 3  Microalbuminuria -     Microalbumin / creatinine urine ratio; Future  Grief reaction -     hydrOXYzine HCl; Take 1 tablet (10 mg total) by mouth 2 (two) times daily as needed for anxiety.  Hypertension, unspecified type Assessment & Plan: Chronic, stable.  Continue metoprolol succinate 50 mg daily, losartan 12.5 mg daily.        Return precautions given.   Risks, benefits, and alternatives of the medications and treatment plan prescribed today were discussed, and patient expressed understanding.   Education regarding symptom management and diagnosis given to patient on AVS either electronically or printed.  Return in about 3 months (around 09/23/2023).  Rennie Plowman, FNP  Subjective:    Patient ID: Megan Tucker, female    DOB: 1986/06/04, 37 y.o.   MRN: [id number]  CC: Megan Tucker is a 37 y.o. female who presents today for an acute visit.    HPI: Complains of discoloration of fingernails.   She has been using UV light ( nail dryer)  Symptoms consist with starting use of gel nail polish and nail dryer  No black streaks  No h/o   She is taking biotin OTC     She has been using hydroxyzine more often for anxiety prn.  Medication is working well.   Allergies: Amlodipine and Adhesive [tape] Current Outpatient Medications on File Prior to Visit  Medication Sig Dispense Refill   amphetamine-dextroamphetamine (ADDERALL XR) 20 MG 24 hr capsule Take 2  capsules (40 mg total) by mouth every morning. 60 capsule 0   amphetamine-dextroamphetamine (ADDERALL XR) 20 MG 24 hr capsule Take 2 capsules (40 mg total) by mouth every morning. 60 capsule 0   amphetamine-dextroamphetamine (ADDERALL XR) 20 MG 24 hr capsule Take 2 capsules (40 mg total) by mouth every morning. 30 capsule 0   amphetamine-dextroamphetamine (ADDERALL) 5 MG tablet Take 1 tablet (5 mg total) by mouth daily. 30 tablet 0   amphetamine-dextroamphetamine (ADDERALL) 5 MG tablet Take 1 tablet (5 mg total) by mouth daily. 30 tablet 0   amphetamine-dextroamphetamine (ADDERALL) 5 MG tablet Take 1 tablet (5 mg total) by mouth daily. 30 tablet 0   No current facility-administered medications on file prior to visit.    Review of Systems  Constitutional:  Negative for chills and fever.  Respiratory:  Negative for cough.   Cardiovascular:  Negative for chest pain and palpitations.  Gastrointestinal:  Negative for nausea and vomiting.      Objective:    BP 122/86   Pulse 86   Temp 98.6 F (37 C)   Ht 5\' 4"  (1.626 m)   Wt 180 lb 9.6 oz (81.9 kg)   SpO2 98%   BMI 31.00 kg/m   BP Readings from Last 3 Encounters:  06/23/23 122/86  12/14/22 122/82  07/16/22 124/80   Wt Readings from Last 3 Encounters:  06/23/23 180 lb 9.6 oz (81.9 kg)  12/14/22 171 lb  9.6 oz (77.8 kg)  07/16/22 196 lb 3.2 oz (89 kg)    Physical Exam Vitals reviewed.  Constitutional:      Appearance: She is well-developed.  Eyes:     Conjunctiva/sclera: Conjunctivae normal.  Cardiovascular:     Rate and Rhythm: Normal rate and regular rhythm.     Pulses: Normal pulses.     Heart sounds: Normal heart sounds.  Pulmonary:     Effort: Pulmonary effort is normal.     Breath sounds: Normal breath sounds. No wheezing, rhonchi or rales.  Skin:    General: Skin is warm and dry.  Neurological:     Mental Status: She is alert.  Psychiatric:        Speech: Speech normal.        Behavior: Behavior normal.         Thought Content: Thought content normal.    Thick brittle discolored nails noted.

## 2023-06-23 NOTE — Patient Instructions (Addendum)
Start Lamisil   Liver enzymes checked at [facility] in 6 weeks time   I have included information regarding Employee Assistance Program (EAP)  through [facility].   [email or web address]  [email or web address]   EAP phone to connect to counselors [phone]  Crisis and Suicide line, available 24 hours per day, 7 days per week [phone]  Virtual appointment scheduling link below:   [email or web address]  Fungal Nail Infection A fungal nail infection is a common infection of the toenails or fingernails. This condition affects toenails more often than fingernails. It often affects the great, or big, toes. More than one nail may be infected. The condition can be passed from person to person (is contagious). What are the causes? This condition is caused by a fungus, such as yeast or molds. Several types of fungi can cause the infection. These fungi are common in moist and warm areas. If your hands or feet come into contact with the fungus, it may get into a crack in your fingernail or toenail or in the surrounding skin, and cause an infection. What increases the risk? The following factors may make you more likely to develop this condition: Being of older age. Having certain medical conditions, such as: Athlete's foot. Diabetes. Poor circulation. A weak body defense system (immune system). Walking barefoot in areas where the fungus thrives, such as showers or locker rooms. Wearing shoes and socks that cause your feet to sweat. Having a nail injury or a recent nail surgery. What are the signs or symptoms? Symptoms of this condition include: A pale spot on the nail. Thickening of the nail. A nail that becomes yellow,  brown, or white. A brittle or ragged nail edge. A nail that has lifted away from the nail bed. How is this diagnosed? This condition is diagnosed with a physical exam. Your health care provider may take a scraping or clipping from your nail to test for the fungus. How is this treated? Treatment is not needed for mild infections. If you have significant nail changes, treatment may include: Antifungal medicines taken by mouth (orally). You may need to take the medicine for several weeks or several months, and you may not see the results for a long time. These medicines can cause side effects. Ask your health care provider what problems to watch for. Antifungal nail polish or nail cream. These may be used along with oral antifungal medicines. Laser treatment of the nail. Surgery to remove the nail. This may be needed for the most severe infections. It can take a long time, usually up to a year, for the infection to go away. The infection may also come back. Follow these instructions at home: Medicines Take or apply over-the-counter and prescription medicines only as told by your health care provider. Ask your health care provider about using over-the-counter mentholated ointment on your nails. Nail care Trim your nails often. Wash and dry your hands and feet every day. Keep your feet dry. To do this: Wear absorbent socks, and change your socks frequently. Wear shoes that allow air to circulate, such as sandals or canvas tennis shoes. Throw out old shoes. If you go to a nail salon, make sure you choose one that uses clean instruments. Use antifungal foot powder on your feet and in your shoes. General instructions Do not share personal items, such as towels or nail clippers. Do not walk barefoot in shower rooms or locker rooms. Wear rubber gloves if you are working with your hands in wet areas. Keep all  follow-up visits. This is important. Contact a health care provider if: You have redness,  pain, or pus near the toenail or fingernail. Your infection is not getting better, or it is getting worse after several months. You have more circulation problems near the toenail or fingernail. You have brown or black discoloration of the nail that spreads to the surrounding skin. Summary A fungal nail infection is a common infection of the toenails or fingernails. Treatment is not needed for mild infections. If you have significant nail changes, treatment may include taking medicine orally and applying medicine to your nails. It can take a long time, usually up to a year, for the infection to go away. The infection may also come back. Take or apply over-the-counter and prescription medicines only as told by your health care provider. This information is not intended to replace advice given to you by your health care provider. Make sure you discuss any questions you have with your health care provider. Document Revised: 02/10/2021 Document Reviewed: 02/10/2021 Elsevier Patient Education  2024 [employer].

## 2023-06-24 ENCOUNTER — Other Ambulatory Visit: Payer: Self-pay

## 2023-06-24 MED ORDER — METOPROLOL SUCCINATE ER 50 MG PO TB24
50.0000 mg | ORAL_TABLET | Freq: Every day | ORAL | 1 refills | Status: DC
  Filled 2023-06-24: qty 90, 90d supply, fill #0
  Filled 2023-10-07: qty 90, 90d supply, fill #1

## 2023-06-28 NOTE — Assessment & Plan Note (Signed)
presentation consistent with onychomycosis.  Start terbinafine.  Recheck liver enzymes 6 weeks.

## 2023-06-28 NOTE — Assessment & Plan Note (Signed)
Chronic, stable.  Continue metoprolol succinate 50 mg daily, losartan 12.5 mg daily.

## 2023-06-29 ENCOUNTER — Ambulatory Visit: Payer: Commercial Managed Care - PPO | Admitting: Family

## 2023-07-18 ENCOUNTER — Other Ambulatory Visit: Payer: Self-pay | Admitting: Family

## 2023-07-18 ENCOUNTER — Other Ambulatory Visit: Payer: Self-pay

## 2023-07-18 DIAGNOSIS — F4321 Adjustment disorder with depressed mood: Secondary | ICD-10-CM

## 2023-07-18 DIAGNOSIS — F902 Attention-deficit hyperactivity disorder, combined type: Secondary | ICD-10-CM

## 2023-07-19 ENCOUNTER — Other Ambulatory Visit: Payer: Self-pay

## 2023-07-19 ENCOUNTER — Other Ambulatory Visit: Payer: Self-pay | Admitting: Family

## 2023-07-19 DIAGNOSIS — F902 Attention-deficit hyperactivity disorder, combined type: Secondary | ICD-10-CM

## 2023-07-19 MED ORDER — AMPHETAMINE-DEXTROAMPHET ER 20 MG PO CP24
40.0000 mg | ORAL_CAPSULE | ORAL | 0 refills | Status: DC
  Filled 2023-07-19: qty 60, 30d supply, fill #0

## 2023-07-19 MED ORDER — HYDROXYZINE HCL 10 MG PO TABS
10.0000 mg | ORAL_TABLET | Freq: Two times a day (BID) | ORAL | 0 refills | Status: DC | PRN
  Filled 2023-07-19: qty 30, 15d supply, fill #0

## 2023-07-19 MED ORDER — AMPHETAMINE-DEXTROAMPHETAMINE 5 MG PO TABS
5.0000 mg | ORAL_TABLET | Freq: Every day | ORAL | 0 refills | Status: DC
Start: 2023-07-19 — End: 2023-08-30
  Filled 2023-07-19: qty 30, 30d supply, fill #0

## 2023-07-19 MED ORDER — HYDROXYZINE HCL 10 MG PO TABS: 10.0000 mg | ORAL_TABLET | Freq: Two times a day (BID) | ORAL | Status: DC | PRN

## 2023-07-19 MED ORDER — AMPHETAMINE-DEXTROAMPHET ER 20 MG PO CP24
40.0000 mg | ORAL_CAPSULE | ORAL | 0 refills | Status: DC
Start: 2023-07-19 — End: 2023-08-30
  Filled 2023-07-19: qty 60, 30d supply, fill #0

## 2023-07-19 MED FILL — Amphetamine-Dextroamphetamine Cap ER 24HR 20 MG: ORAL | 30 days supply | Qty: 60 | Fill #0 | Status: AC

## 2023-07-19 MED FILL — Amphetamine-Dextroamphetamine Tab 5 MG: ORAL | 30 days supply | Qty: 30 | Fill #0 | Status: AC

## 2023-07-19 NOTE — Telephone Encounter (Signed)
Adderall 20 mg Refilled: 03/15/2023 Adderall 5 mg Refilled: 03/19/2023  Last OV: 06/23/2023 Next OV: 09/23/2023

## 2023-07-19 NOTE — Telephone Encounter (Signed)
Rx resent. Was sent in the morning as a "no print"

## 2023-07-19 NOTE — Progress Notes (Signed)
close

## 2023-07-19 NOTE — Addendum Note (Signed)
Addended by: Warden Fillers on: 07/19/2023 12:17 PM   Modules accepted: Orders

## 2023-07-20 ENCOUNTER — Other Ambulatory Visit: Payer: Self-pay

## 2023-07-23 ENCOUNTER — Other Ambulatory Visit: Payer: Self-pay

## 2023-08-13 ENCOUNTER — Encounter: Payer: Self-pay | Admitting: Family

## 2023-08-30 ENCOUNTER — Other Ambulatory Visit: Payer: Self-pay | Admitting: Family

## 2023-08-30 ENCOUNTER — Encounter: Payer: Self-pay | Admitting: Family

## 2023-08-30 ENCOUNTER — Other Ambulatory Visit: Payer: Self-pay

## 2023-08-30 ENCOUNTER — Ambulatory Visit: Payer: Commercial Managed Care - PPO | Admitting: Family

## 2023-08-30 VITALS — BP 128/82 | HR 68 | Temp 98.0°F | Ht 64.0 in | Wt 195.6 lb

## 2023-08-30 DIAGNOSIS — F4321 Adjustment disorder with depressed mood: Secondary | ICD-10-CM

## 2023-08-30 DIAGNOSIS — F902 Attention-deficit hyperactivity disorder, combined type: Secondary | ICD-10-CM | POA: Diagnosis not present

## 2023-08-30 DIAGNOSIS — R519 Headache, unspecified: Secondary | ICD-10-CM | POA: Insufficient documentation

## 2023-08-30 MED ORDER — AMPHETAMINE-DEXTROAMPHET ER 20 MG PO CP24
40.0000 mg | ORAL_CAPSULE | ORAL | 0 refills | Status: DC
  Filled 2023-08-30 – 2023-11-11 (×2): qty 60, 30d supply, fill #0

## 2023-08-30 MED ORDER — AMPHETAMINE-DEXTROAMPHETAMINE 5 MG PO TABS
5.0000 mg | ORAL_TABLET | Freq: Every day | ORAL | 0 refills | Status: DC
  Filled 2023-08-30: qty 30, 30d supply, fill #0

## 2023-08-30 MED ORDER — AMPHETAMINE-DEXTROAMPHETAMINE 5 MG PO TABS
5.0000 mg | ORAL_TABLET | Freq: Every day | ORAL | 0 refills | Status: DC
  Filled 2023-08-30 – 2023-11-11 (×2): qty 30, 30d supply, fill #0

## 2023-08-30 MED ORDER — AMPHETAMINE-DEXTROAMPHET ER 20 MG PO CP24
40.0000 mg | ORAL_CAPSULE | ORAL | 0 refills | Status: DC
  Filled 2023-08-30 – 2023-10-07 (×2): qty 60, 30d supply, fill #0

## 2023-08-30 MED ORDER — HYDROXYZINE HCL 10 MG PO TABS
10.0000 mg | ORAL_TABLET | Freq: Two times a day (BID) | ORAL | 2 refills | Status: DC | PRN
  Filled 2023-08-30: qty 30, 15d supply, fill #0
  Filled 2023-10-07: qty 30, 15d supply, fill #1
  Filled 2023-11-09: qty 30, 15d supply, fill #2

## 2023-08-30 MED ORDER — AMPHETAMINE-DEXTROAMPHETAMINE 5 MG PO TABS
5.0000 mg | ORAL_TABLET | Freq: Every day | ORAL | 0 refills | Status: DC
Start: 2023-08-30 — End: 2023-12-24
  Filled 2023-08-30 – 2023-10-07 (×2): qty 30, 30d supply, fill #0

## 2023-08-30 MED ORDER — AMPHETAMINE-DEXTROAMPHET ER 20 MG PO CP24
40.0000 mg | ORAL_CAPSULE | ORAL | 0 refills | Status: DC
  Filled 2023-08-30: qty 60, 30d supply, fill #0

## 2023-08-30 NOTE — Patient Instructions (Signed)
Headache journal  Focus on good sleep  Trial magnesium citrate 400mg  over the counter  Limit NSAIDs ( and maybe coffee a hair!)

## 2023-08-30 NOTE — Assessment & Plan Note (Signed)
Reassuring neurologic exam today.  No alarm features at this time.  Discussed trial of magnesium citrate 400 mg over-the-counter, keeping a headache journal.  Consider Effexor if persists.  She will let me know how she is doing

## 2023-08-30 NOTE — Progress Notes (Signed)
Assessment & Plan:  Acute nonintractable headache, unspecified headache type Assessment & Plan: Reassuring neurologic exam today.  No alarm features at this time.  Discussed trial of magnesium citrate 400 mg over-the-counter, keeping a headache journal.  Consider Effexor if persists.  She will let me know how she is doing   Attention deficit hyperactivity disorder (ADHD), combined type -     Amphetamine-Dextroamphet ER; Take 2 capsules (40 mg total) by mouth every morning.  Dispense: 60 capsule; Refill: 0 -     Amphetamine-Dextroamphetamine; Take 1 tablet (5 mg total) by mouth daily.  Dispense: 30 tablet; Refill: 0 -     Amphetamine-Dextroamphetamine; Take 1 tablet (5 mg total) by mouth daily.  Dispense: 30 tablet; Refill: 0 -     Amphetamine-Dextroamphet ER; Take 2 capsules (40 mg total) by mouth every morning.  Dispense: 60 capsule; Refill: 0 -     Amphetamine-Dextroamphet ER; Take 2 capsules (40 mg total) by mouth every morning.  Dispense: 60 capsule; Refill: 0 -     Amphetamine-Dextroamphetamine; Take 1 tablet (5 mg total) by mouth daily.  Dispense: 30 tablet; Refill: 0  Grief reaction -     hydrOXYzine HCl; Take 1 tablet (10 mg total) by mouth 2 (two) times daily as needed for anxiety.  Dispense: 30 tablet; Refill: 2     Return precautions given.   Risks, benefits, and alternatives of the medications and treatment plan prescribed today were discussed, and patient expressed understanding.   Education regarding symptom management and diagnosis given to patient on AVS either electronically or printed.  Return in about 3 months (around 11/30/2023).  Rennie Plowman, FNP  Subjective:    Patient ID: Tyrone Nine, female    DOB: 05-13-1986, 37 y.o.   MRN: [id number]  CC: JULIEANNE HADSALL is a 37 y.o. female who presents today for an acute visit.    HPI: Complains of episodic HA bilateral temporal x 4 weeks  She took ibuprofen and tylenol without relief; last dose a couple of weeks  ago.  HA may move to one temporal side or the other.   No associated vomiting, N, vision changes, numbness of the face, fever, sinus pain.   HA are not positional nor awaken her from sleep. HA are not worse HA of her life.   HA is worse by 'loud sounds and light'.   Heating pad and cool washcloth with some relief.   She is not taking adderall during the week days.   She drinks approx 36 oz coffee per day.   Sleep is restorative.     Former smoker Occasional wine  Allergies: Amlodipine and Adhesive [tape] Current Outpatient Medications on File Prior to Visit  Medication Sig Dispense Refill   amphetamine-dextroamphetamine (ADDERALL XR) 20 MG 24 hr capsule Take 2 capsules (40 mg total) by mouth every morning. 60 capsule 0   amphetamine-dextroamphetamine (ADDERALL) 5 MG tablet Take 1 tablet (5 mg total) by mouth daily. 30 tablet 0   losartan (COZAAR) 25 MG tablet Take 1 tablet (25 mg total) by mouth daily. 90 tablet 3   metoprolol succinate (TOPROL-XL) 50 MG 24 hr tablet Take 1 tablet (50 mg total) by mouth daily take with or immediately following a meal. 90 tablet 1   terbinafine (LAMISIL) 250 MG tablet Take 1 tablet once daily for 4 weeks, off for 4 weeks, then resume with 1 tablet once daily for 4 weeks, 12 wks treatment 56 tablet 0   No current facility-administered medications on  file prior to visit.    Review of Systems  Constitutional:  Negative for chills and fever.  Eyes:  Negative for visual disturbance.  Respiratory:  Negative for cough.   Cardiovascular:  Negative for chest pain and palpitations.  Gastrointestinal:  Negative for nausea and vomiting.  Neurological:  Positive for headaches. Negative for dizziness.      Objective:    BP 128/82   Pulse 68   Temp 98 F (36.7 C) (Oral)   Ht 5\' 4"  (1.626 m)   Wt 195 lb 9.6 oz (88.7 kg)   SpO2 99%   BMI 33.57 kg/m   BP Readings from Last 3 Encounters:  08/30/23 128/82  06/23/23 122/86  12/14/22 122/82   Wt  Readings from Last 3 Encounters:  08/30/23 195 lb 9.6 oz (88.7 kg)  06/23/23 180 lb 9.6 oz (81.9 kg)  12/14/22 171 lb 9.6 oz (77.8 kg)    Physical Exam Vitals reviewed.  Constitutional:      Appearance: She is well-developed.  HENT:     Mouth/Throat:     Pharynx: Uvula midline.  Eyes:     Conjunctiva/sclera: Conjunctivae normal.     Pupils: Pupils are equal, round, and reactive to light.     Comments: Fundus normal bilaterally.   Cardiovascular:     Rate and Rhythm: Normal rate and regular rhythm.     Pulses: Normal pulses.     Heart sounds: Normal heart sounds.  Pulmonary:     Effort: Pulmonary effort is normal.     Breath sounds: Normal breath sounds. No wheezing, rhonchi or rales.  Skin:    General: Skin is warm and dry.  Neurological:     Mental Status: She is alert.     Cranial Nerves: No cranial nerve deficit.     Sensory: No sensory deficit.     Deep Tendon Reflexes:     Reflex Scores:      Bicep reflexes are 2+ on the right side and 2+ on the left side.      Patellar reflexes are 2+ on the right side and 2+ on the left side.    Comments: Grip equal and strong bilateral upper extremities. Gait strong and steady. Able to perform rapid alternating movement without difficulty.   Psychiatric:        Speech: Speech normal.        Behavior: Behavior normal.        Thought Content: Thought content normal.

## 2023-09-23 ENCOUNTER — Ambulatory Visit: Payer: Commercial Managed Care - PPO | Admitting: Family

## 2023-10-07 ENCOUNTER — Other Ambulatory Visit: Payer: Self-pay

## 2023-11-02 ENCOUNTER — Other Ambulatory Visit: Payer: Self-pay

## 2023-11-09 ENCOUNTER — Other Ambulatory Visit: Payer: Self-pay | Admitting: Family

## 2023-11-09 DIAGNOSIS — F902 Attention-deficit hyperactivity disorder, combined type: Secondary | ICD-10-CM

## 2023-11-09 NOTE — Telephone Encounter (Signed)
20 mg refilled: 08/30/2023 5 mg refilled: 10/72024 Last OV: 10/72024 Next OV: 11/29/2022

## 2023-11-10 ENCOUNTER — Other Ambulatory Visit: Payer: Self-pay

## 2023-11-11 ENCOUNTER — Other Ambulatory Visit: Payer: Self-pay

## 2023-11-29 ENCOUNTER — Encounter: Payer: Self-pay | Admitting: Family

## 2023-11-30 ENCOUNTER — Telehealth: Payer: Self-pay | Admitting: Family

## 2023-11-30 ENCOUNTER — Ambulatory Visit: Payer: Commercial Managed Care - PPO | Admitting: Family

## 2023-11-30 NOTE — Telephone Encounter (Signed)
 Left message for patient to call and reschedule appt/provider unavailable today. Please schedule patient next available appointment.

## 2023-12-23 ENCOUNTER — Other Ambulatory Visit: Payer: Self-pay | Admitting: Family

## 2023-12-23 ENCOUNTER — Other Ambulatory Visit: Payer: Self-pay

## 2023-12-23 DIAGNOSIS — F902 Attention-deficit hyperactivity disorder, combined type: Secondary | ICD-10-CM

## 2023-12-23 DIAGNOSIS — F4321 Adjustment disorder with depressed mood: Secondary | ICD-10-CM

## 2023-12-24 ENCOUNTER — Other Ambulatory Visit: Payer: Self-pay

## 2023-12-24 ENCOUNTER — Other Ambulatory Visit: Payer: Self-pay | Admitting: Family

## 2023-12-24 DIAGNOSIS — F902 Attention-deficit hyperactivity disorder, combined type: Secondary | ICD-10-CM

## 2023-12-24 MED ORDER — AMPHETAMINE-DEXTROAMPHETAMINE 5 MG PO TABS
5.0000 mg | ORAL_TABLET | Freq: Every day | ORAL | 0 refills | Status: DC
  Filled 2023-12-24: qty 30, 30d supply, fill #0

## 2023-12-24 MED ORDER — AMPHETAMINE-DEXTROAMPHET ER 20 MG PO CP24
40.0000 mg | ORAL_CAPSULE | ORAL | 0 refills | Status: DC
  Filled 2023-12-24: qty 60, 30d supply, fill #0

## 2023-12-24 MED ORDER — AMPHETAMINE-DEXTROAMPHET ER 20 MG PO CP24
40.0000 mg | ORAL_CAPSULE | Freq: Every morning | ORAL | 0 refills | Status: DC
  Filled 2023-12-24: qty 60, 30d supply, fill #0

## 2023-12-24 MED FILL — Amphetamine-Dextroamphetamine Cap ER 24HR 20 MG: ORAL | 30 days supply | Qty: 60 | Fill #0 | Status: AC

## 2023-12-24 MED FILL — Hydroxyzine HCl Tab 10 MG: ORAL | 15 days supply | Qty: 30 | Fill #0 | Status: AC

## 2023-12-24 MED FILL — Amphetamine-Dextroamphetamine Tab 5 MG: ORAL | 30 days supply | Qty: 30 | Fill #0 | Status: AC

## 2023-12-27 ENCOUNTER — Other Ambulatory Visit: Payer: Self-pay

## 2023-12-31 NOTE — Telephone Encounter (Signed)
 Spoke to pt and scheduled f/up appt for 01/13/24

## 2024-01-13 ENCOUNTER — Ambulatory Visit: Payer: Commercial Managed Care - PPO | Admitting: Family

## 2024-01-20 ENCOUNTER — Other Ambulatory Visit: Payer: Self-pay

## 2024-01-20 ENCOUNTER — Ambulatory Visit: Payer: Commercial Managed Care - PPO | Admitting: Family

## 2024-01-20 VITALS — BP 120/80 | HR 80 | Temp 98.3°F | Ht 64.0 in | Wt 199.0 lb

## 2024-01-20 DIAGNOSIS — F902 Attention-deficit hyperactivity disorder, combined type: Secondary | ICD-10-CM

## 2024-01-20 DIAGNOSIS — I10 Essential (primary) hypertension: Secondary | ICD-10-CM | POA: Diagnosis not present

## 2024-01-20 LAB — COMPREHENSIVE METABOLIC PANEL
ALT: 13 U/L (ref 0–35)
AST: 18 U/L (ref 0–37)
Albumin: 4.3 g/dL (ref 3.5–5.2)
Alkaline Phosphatase: 46 U/L (ref 39–117)
BUN: 11 mg/dL (ref 6–23)
CO2: 25 meq/L (ref 19–32)
Calcium: 9.3 mg/dL (ref 8.4–10.5)
Chloride: 106 meq/L (ref 96–112)
Creatinine, Ser: 0.84 mg/dL (ref 0.40–1.20)
GFR: 88.12 mL/min (ref >60.00)
Glucose, Bld: 87 mg/dL (ref 70–99)
Potassium: 4 meq/L (ref 3.5–5.1)
Sodium: 138 meq/L (ref 135–145)
Total Bilirubin: 0.4 mg/dL (ref 0.2–1.2)
Total Protein: 7.3 g/dL (ref 6.0–8.3)

## 2024-01-20 LAB — LIPID PANEL
Cholesterol: 159 mg/dL (ref 0–200)
HDL: 44 mg/dL (ref >39.00)
LDL Cholesterol: 93 mg/dL (ref 0–99)
NonHDL: 115.49
Total CHOL/HDL Ratio: 4
Triglycerides: 114 mg/dL (ref 0.0–149.0)
VLDL: 22.8 mg/dL (ref 0.0–40.0)

## 2024-01-20 LAB — CBC WITH DIFFERENTIAL/PLATELET
Basophils Absolute: 0.1 10*3/uL (ref 0.0–0.1)
Basophils Relative: 2 % (ref 0.0–3.0)
Eosinophils Absolute: 0.3 10*3/uL (ref 0.0–0.7)
Eosinophils Relative: 4.5 % (ref 0.0–5.0)
HCT: 35.1 % — ABNORMAL LOW (ref 36.0–46.0)
Hemoglobin: 11.4 g/dL — ABNORMAL LOW (ref 12.0–15.0)
Lymphocytes Relative: 30.7 % (ref 12.0–46.0)
Lymphs Abs: 2.2 10*3/uL (ref 0.7–4.0)
MCHC: 32.5 g/dL (ref 30.0–36.0)
MCV: 78.1 fl (ref 78.0–100.0)
Monocytes Absolute: 0.6 10*3/uL (ref 0.1–1.0)
Monocytes Relative: 8.6 % (ref 3.0–12.0)
Neutro Abs: 3.9 10*3/uL (ref 1.4–7.7)
Neutrophils Relative %: 54.2 % (ref 43.0–77.0)
Platelets: 382 10*3/uL (ref 150.0–400.0)
RBC: 4.49 Mil/uL (ref 3.87–5.11)
RDW: 17.1 % — ABNORMAL HIGH (ref 11.5–15.5)
WBC: 7.2 10*3/uL (ref 4.0–10.5)

## 2024-01-20 LAB — TSH: TSH: 1.23 u[IU]/mL (ref 0.35–5.50)

## 2024-01-20 LAB — HEMOGLOBIN A1C: Hgb A1c MFr Bld: 6.5 % (ref 4.6–6.5)

## 2024-01-20 LAB — VITAMIN D 25 HYDROXY (VIT D DEFICIENCY, FRACTURES): VITD: 20.5 ng/mL — ABNORMAL LOW (ref 30.00–100.00)

## 2024-01-20 MED ORDER — METOPROLOL SUCCINATE ER 50 MG PO TB24
50.0000 mg | ORAL_TABLET | Freq: Every day | ORAL | 3 refills | Status: AC
  Filled 2024-01-20 – 2024-02-09 (×2): qty 90, 90d supply, fill #0
  Filled 2024-04-24: qty 90, 90d supply, fill #1
  Filled 2024-10-09: qty 90, 90d supply, fill #2

## 2024-01-20 NOTE — Patient Instructions (Signed)
Nice to see you!   

## 2024-01-20 NOTE — Assessment & Plan Note (Signed)
 Chronic, stable. Continue adderall IR 5mg  to use daily in the afternoon as needed. Continue adderall XR 40mg  qd.

## 2024-01-20 NOTE — Progress Notes (Signed)
 Assessment & Plan:  Attention deficit hyperactivity disorder (ADHD), combined type Assessment & Plan: Chronic, stable. Continue adderall IR 5mg  to use daily in the afternoon as needed. Continue adderall XR 40mg  qd.     Hypertension, unspecified type Assessment & Plan: Chronic, stable.  Continue metoprolol succinate 50 mg daily, losartan 25 mg daily.    Orders: -     Metoprolol Succinate ER; Take 1 tablet (50 mg total) by mouth daily take with or immediately following a meal.  Dispense: 90 tablet; Refill: 3 -     Comprehensive metabolic panel -     Lipid panel -     Hemoglobin A1c -     VITAMIN D 25 Hydroxy (Vit-D Deficiency, Fractures) -     CBC with Differential/Platelet -     TSH     Return precautions given.   Risks, benefits, and alternatives of the medications and treatment plan prescribed today were discussed, and patient expressed understanding.   Education regarding symptom management and diagnosis given to patient on AVS either electronically or printed.  No follow-ups on file.  Rennie Plowman, FNP  Subjective:    Patient ID: Megan Tucker, female    DOB: September 20, 1986, 38 y.o.   MRN: [id number]  CC: Megan Tucker is a 38 y.o. female who presents today for follow up.   HPI: Feels well today.  No new complaints.  She is compliant with losartan, Toprol XL. She monitors blood pressure at home and at work.  Readings have been well-controlled, 120/80.  Denies chest pain, shortness of breath, palpitations   She is doing well on Adderall XR 40 mg daily when she works.  She will take a 20 mg Adderall XR all days when she is home.  Regimen is working well.  She will occasionally use Adderall 5 mg mid afternoon.  She is sleeping well.    Allergies: Amlodipine and Adhesive [tape] Current Outpatient Medications on File Prior to Visit  Medication Sig Dispense Refill   amphetamine-dextroamphetamine (ADDERALL XR) 20 MG 24 hr capsule Take 2 capsules (40 mg total) by mouth every  morning. 60 capsule 0   amphetamine-dextroamphetamine (ADDERALL XR) 20 MG 24 hr capsule Take 2 capsules (40 mg total) by mouth every morning. 60 capsule 0   amphetamine-dextroamphetamine (ADDERALL XR) 20 MG 24 hr capsule Take 2 capsules (40 mg total) by mouth every morning. 60 capsule 0   amphetamine-dextroamphetamine (ADDERALL) 5 MG tablet Take 1 tablet (5 mg total) by mouth daily. 30 tablet 0   amphetamine-dextroamphetamine (ADDERALL) 5 MG tablet Take 1 tablet (5 mg total) by mouth daily. 30 tablet 0   amphetamine-dextroamphetamine (ADDERALL) 5 MG tablet Take 1 tablet (5 mg total) by mouth daily. 30 tablet 0   hydrOXYzine (ATARAX) 10 MG tablet Take 1 tablet (10 mg total) by mouth 2 (two) times daily as needed for anxiety. 30 tablet 2   losartan (COZAAR) 25 MG tablet Take 1 tablet (25 mg total) by mouth daily. 90 tablet 3   amphetamine-dextroamphetamine (ADDERALL XR) 20 MG 24 hr capsule Take 2 capsules (40 mg total) by mouth every morning. 60 capsule 0   amphetamine-dextroamphetamine (ADDERALL XR) 20 MG 24 hr capsule Take 2 capsules (40 mg total) by mouth every morning. 60 capsule 0   amphetamine-dextroamphetamine (ADDERALL) 5 MG tablet Take 1 tablet (5 mg total) by mouth daily. 30 tablet 0   amphetamine-dextroamphetamine (ADDERALL) 5 MG tablet Take 1 tablet (5 mg total) by mouth daily. 30 tablet 0  No current facility-administered medications on file prior to visit.    Review of Systems  Constitutional:  Negative for chills and fever.  Respiratory:  Negative for cough.   Cardiovascular:  Negative for chest pain and palpitations.  Gastrointestinal:  Negative for nausea and vomiting.  Psychiatric/Behavioral:  Negative for sleep disturbance.       Objective:    BP 120/80   Pulse 80   Temp 98.3 F (36.8 C) (Oral)   Ht 5\' 4"  (1.626 m)   Wt 199 lb (90.3 kg)   SpO2 98%   BMI 34.16 kg/m  BP Readings from Last 3 Encounters:  01/20/24 120/80  08/30/23 128/82  06/23/23 122/86   Wt  Readings from Last 3 Encounters:  01/20/24 199 lb (90.3 kg)  08/30/23 195 lb 9.6 oz (88.7 kg)  06/23/23 180 lb 9.6 oz (81.9 kg)    Physical Exam Vitals reviewed.  Constitutional:      Appearance: She is well-developed.  Eyes:     Conjunctiva/sclera: Conjunctivae normal.  Cardiovascular:     Rate and Rhythm: Normal rate and regular rhythm.     Pulses: Normal pulses.     Heart sounds: Normal heart sounds.  Pulmonary:     Effort: Pulmonary effort is normal.     Breath sounds: Normal breath sounds. No wheezing, rhonchi or rales.  Skin:    General: Skin is warm and dry.  Neurological:     Mental Status: She is alert.  Psychiatric:        Speech: Speech normal.        Behavior: Behavior normal.        Thought Content: Thought content normal.

## 2024-01-20 NOTE — Assessment & Plan Note (Signed)
 Chronic, stable.  Continue metoprolol succinate 50 mg daily, losartan 25 mg daily.

## 2024-01-26 ENCOUNTER — Encounter: Payer: Self-pay | Admitting: Family

## 2024-01-26 ENCOUNTER — Other Ambulatory Visit: Payer: Self-pay | Admitting: Family

## 2024-01-26 DIAGNOSIS — R899 Unspecified abnormal finding in specimens from other organs, systems and tissues: Secondary | ICD-10-CM

## 2024-02-01 ENCOUNTER — Other Ambulatory Visit: Payer: Self-pay

## 2024-02-09 ENCOUNTER — Other Ambulatory Visit: Payer: Self-pay

## 2024-02-09 ENCOUNTER — Other Ambulatory Visit: Payer: Self-pay | Admitting: Family

## 2024-02-09 DIAGNOSIS — F902 Attention-deficit hyperactivity disorder, combined type: Secondary | ICD-10-CM

## 2024-02-09 MED FILL — Hydroxyzine HCl Tab 10 MG: ORAL | 15 days supply | Qty: 30 | Fill #1 | Status: AC

## 2024-02-10 ENCOUNTER — Other Ambulatory Visit: Payer: Self-pay

## 2024-02-10 ENCOUNTER — Other Ambulatory Visit: Payer: Self-pay | Admitting: Family

## 2024-02-10 DIAGNOSIS — F902 Attention-deficit hyperactivity disorder, combined type: Secondary | ICD-10-CM

## 2024-02-10 MED ORDER — AMPHETAMINE-DEXTROAMPHET ER 20 MG PO CP24
40.0000 mg | ORAL_CAPSULE | ORAL | 0 refills | Status: AC
  Filled 2024-02-10 – 2024-06-25 (×2): qty 60, 30d supply, fill #0

## 2024-02-10 MED ORDER — AMPHETAMINE-DEXTROAMPHET ER 20 MG PO CP24
40.0000 mg | ORAL_CAPSULE | ORAL | 0 refills | Status: DC
  Filled 2024-02-10: qty 60, 30d supply, fill #0

## 2024-02-10 MED ORDER — AMPHETAMINE-DEXTROAMPHETAMINE 5 MG PO TABS
5.0000 mg | ORAL_TABLET | Freq: Every day | ORAL | 0 refills | Status: DC
  Filled 2024-02-10 – 2024-06-25 (×2): qty 30, 30d supply, fill #0

## 2024-02-10 MED ORDER — AMPHETAMINE-DEXTROAMPHETAMINE 5 MG PO TABS
5.0000 mg | ORAL_TABLET | Freq: Every day | ORAL | 0 refills | Status: DC
  Filled 2024-02-10 – 2024-04-24 (×2): qty 30, 30d supply, fill #0

## 2024-02-10 MED ORDER — AMPHETAMINE-DEXTROAMPHET ER 20 MG PO CP24
40.0000 mg | ORAL_CAPSULE | ORAL | 0 refills | Status: DC
  Filled 2024-02-10 – 2024-04-24 (×2): qty 60, 30d supply, fill #0

## 2024-02-10 MED ORDER — AMPHETAMINE-DEXTROAMPHETAMINE 5 MG PO TABS
1.0000 | ORAL_TABLET | Freq: Every day | ORAL | 0 refills | Status: DC
  Filled 2024-02-10: qty 30, 30d supply, fill #0

## 2024-02-15 ENCOUNTER — Other Ambulatory Visit: Payer: Self-pay | Admitting: Family

## 2024-02-15 DIAGNOSIS — N840 Polyp of corpus uteri: Secondary | ICD-10-CM

## 2024-02-15 DIAGNOSIS — N921 Excessive and frequent menstruation with irregular cycle: Secondary | ICD-10-CM

## 2024-03-22 ENCOUNTER — Ambulatory Visit: Admitting: Obstetrics

## 2024-03-22 NOTE — Progress Notes (Deleted)
    GYNECOLOGY PROGRESS NOTE  Subjective:  PCP: Calista Catching, FNP  Patient ID: Megan Tucker, female    DOB: May 19, 1986, 38 y.o.   MRN: [id number]  HPI  Patient is a 38 y.o. G19P3003 female who presents for Follow-up surveillance endometrial polyp, heavy menstrual bleeding, anemia referred by PCP Bascom Bossier FNP.  Pt seen Dr Denman Fischer in 2023 who had a discussion on regarding endometrial biopsy findings. Patient with most likely benign endometrial polyp.  Advised that polyps can be a cause of abnormal uterine bleeding.  Would recommend for removal.  Can consider surgical options such as Hysteroscopy D&C with polypectomy, with patient is still interested in hysterectomy can be an option as well.     {Common ambulatory SmartLinks:19316}  Review of Systems {ros; complete:30496}   Objective:   There were no vitals taken for this visit. There is no height or weight on file to calculate BMI.  General appearance: {general exam:16600} Abdomen: {abdominal exam:16834} Pelvic: {pelvic exam:16852::"cervix normal in appearance","external genitalia normal","no adnexal masses or tenderness","no cervical motion tenderness","rectovaginal septum normal","uterus normal size, shape, and consistency","vagina normal without discharge"} Extremities: {extremity exam:5109} Neurologic: {neuro exam:17854}   Assessment/Plan:   No diagnosis found.   There are no diagnoses linked to this encounter.     Sofia Dunn, DO  [facility]

## 2024-03-23 ENCOUNTER — Encounter: Payer: Self-pay | Admitting: Obstetrics

## 2024-04-07 NOTE — Progress Notes (Deleted)
    GYNECOLOGY PROGRESS NOTE  Subjective:  PCP: Calista Catching, FNP  Patient ID: Megan Tucker, female    DOB: [birth year], 38 y.o.   MRN: [id number]  HPI  Patient is a 38 y.o. G22P3003 female who presents for referral from Bascom Bossier FNP for Follow-up surveillance endometrial polyp, heavy menstrual bleeding, anemia   {Common ambulatory SmartLinks:19316}  Review of Systems {ros; complete:30496}   Objective:   There were no vitals taken for this visit. There is no height or weight on file to calculate BMI.  General appearance: {general exam:16600} Abdomen: {abdominal exam:16834} Pelvic: {pelvic exam:16852::"cervix normal in appearance","external genitalia normal","no adnexal masses or tenderness","no cervical motion tenderness","rectovaginal septum normal","uterus normal size, shape, and consistency","vagina normal without discharge"} Extremities: {extremity exam:5109} Neurologic: {neuro exam:17854}   Assessment/Plan:   No diagnosis found.   There are no diagnoses linked to this encounter.     Sofia Dunn, DO [facility]

## 2024-04-12 ENCOUNTER — Ambulatory Visit: Admitting: Obstetrics

## 2024-04-21 NOTE — Progress Notes (Deleted)
    GYNECOLOGY PROGRESS NOTE  Subjective:  PCP: Calista Catching, FNP  Patient ID: Megan Tucker, female    DOB: [birth year], 38 y.o.   MRN: [id number]  HPI  Patient is a 38 y.o. G22P3003 female who presents for referral from Bascom Bossier FNP for Follow-up surveillance endometrial polyp, heavy menstrual bleeding, anemia   {Common ambulatory SmartLinks:19316}  Review of Systems {ros; complete:30496}   Objective:   There were no vitals taken for this visit. There is no height or weight on file to calculate BMI.  General appearance: {general exam:16600} Abdomen: {abdominal exam:16834} Pelvic: {pelvic exam:16852::"cervix normal in appearance","external genitalia normal","no adnexal masses or tenderness","no cervical motion tenderness","rectovaginal septum normal","uterus normal size, shape, and consistency","vagina normal without discharge"} Extremities: {extremity exam:5109} Neurologic: {neuro exam:17854}   Assessment/Plan:   No diagnosis found.   There are no diagnoses linked to this encounter.     Sofia Dunn, DO [facility]

## 2024-04-24 ENCOUNTER — Other Ambulatory Visit: Payer: Self-pay

## 2024-04-24 MED FILL — Hydroxyzine HCl Tab 10 MG: ORAL | 15 days supply | Qty: 30 | Fill #2 | Status: AC

## 2024-04-26 ENCOUNTER — Ambulatory Visit: Admitting: Obstetrics

## 2024-05-22 NOTE — Progress Notes (Deleted)
    GYNECOLOGY PROGRESS NOTE  Subjective:  PCP: Calista Catching, FNP  Patient ID: Megan Tucker, female    DOB: [birth year], 38 y.o.   MRN: [id number]  HPI  Patient is a 38 y.o. G22P3003 female who presents for referral from Bascom Bossier FNP for Follow-up surveillance endometrial polyp, heavy menstrual bleeding, anemia   {Common ambulatory SmartLinks:19316}  Review of Systems {ros; complete:30496}   Objective:   There were no vitals taken for this visit. There is no height or weight on file to calculate BMI.  General appearance: {general exam:16600} Abdomen: {abdominal exam:16834} Pelvic: {pelvic exam:16852::"cervix normal in appearance","external genitalia normal","no adnexal masses or tenderness","no cervical motion tenderness","rectovaginal septum normal","uterus normal size, shape, and consistency","vagina normal without discharge"} Extremities: {extremity exam:5109} Neurologic: {neuro exam:17854}   Assessment/Plan:   No diagnosis found.   There are no diagnoses linked to this encounter.     Sofia Dunn, DO [facility]

## 2024-05-24 ENCOUNTER — Ambulatory Visit: Admitting: Obstetrics

## 2024-05-25 ENCOUNTER — Encounter: Payer: Self-pay | Admitting: Obstetrics

## 2024-06-25 ENCOUNTER — Other Ambulatory Visit: Payer: Self-pay

## 2024-06-25 ENCOUNTER — Other Ambulatory Visit: Payer: Self-pay | Admitting: Family

## 2024-06-25 DIAGNOSIS — R809 Proteinuria, unspecified: Secondary | ICD-10-CM

## 2024-06-25 DIAGNOSIS — F4320 Adjustment disorder, unspecified: Secondary | ICD-10-CM

## 2024-06-26 ENCOUNTER — Other Ambulatory Visit: Payer: Self-pay | Admitting: Family

## 2024-06-26 ENCOUNTER — Other Ambulatory Visit: Payer: Self-pay

## 2024-06-26 DIAGNOSIS — R809 Proteinuria, unspecified: Secondary | ICD-10-CM

## 2024-06-26 DIAGNOSIS — F4320 Adjustment disorder, unspecified: Secondary | ICD-10-CM

## 2024-06-26 MED FILL — Hydroxyzine HCl Tab 10 MG: ORAL | 15 days supply | Qty: 30 | Fill #0 | Status: AC

## 2024-06-26 MED FILL — Losartan Potassium Tab 25 MG: ORAL | 90 days supply | Qty: 90 | Fill #0 | Status: CN

## 2024-06-28 ENCOUNTER — Other Ambulatory Visit: Payer: Self-pay

## 2024-06-28 MED FILL — Losartan Potassium Tab 25 MG: ORAL | 90 days supply | Qty: 90 | Fill #0 | Status: CN

## 2024-08-01 ENCOUNTER — Other Ambulatory Visit: Payer: Self-pay

## 2024-08-01 ENCOUNTER — Other Ambulatory Visit: Payer: Self-pay | Admitting: Family

## 2024-08-01 DIAGNOSIS — F902 Attention-deficit hyperactivity disorder, combined type: Secondary | ICD-10-CM

## 2024-08-01 MED FILL — Hydroxyzine HCl Tab 10 MG: ORAL | 15 days supply | Qty: 30 | Fill #1 | Status: AC

## 2024-08-02 ENCOUNTER — Other Ambulatory Visit: Payer: Self-pay | Admitting: Family

## 2024-08-02 ENCOUNTER — Other Ambulatory Visit: Payer: Self-pay

## 2024-08-02 DIAGNOSIS — F902 Attention-deficit hyperactivity disorder, combined type: Secondary | ICD-10-CM

## 2024-08-02 MED ORDER — AMPHETAMINE-DEXTROAMPHET ER 20 MG PO CP24
40.0000 mg | ORAL_CAPSULE | Freq: Every morning | ORAL | 0 refills | Status: AC
  Filled 2024-08-02 – 2024-10-09 (×2): qty 60, 30d supply, fill #0

## 2024-08-02 MED ORDER — AMPHETAMINE-DEXTROAMPHETAMINE 5 MG PO TABS
1.0000 | ORAL_TABLET | Freq: Every day | ORAL | 0 refills | Status: AC
  Filled 2024-08-02 – 2024-10-09 (×2): qty 30, 30d supply, fill #0

## 2024-08-02 MED ORDER — AMPHETAMINE-DEXTROAMPHET ER 20 MG PO CP24
40.0000 mg | ORAL_CAPSULE | Freq: Every day | ORAL | 0 refills | Status: DC
  Filled 2024-08-02: qty 60, 30d supply, fill #0

## 2024-08-02 MED ORDER — AMPHETAMINE-DEXTROAMPHETAMINE 5 MG PO TABS
1.0000 | ORAL_TABLET | Freq: Every day | ORAL | 0 refills | Status: AC
  Filled 2024-08-02: qty 30, 30d supply, fill #0

## 2024-08-02 MED ORDER — AMPHETAMINE-DEXTROAMPHETAMINE 5 MG PO TABS
5.0000 mg | ORAL_TABLET | Freq: Every day | ORAL | 0 refills | Status: DC
  Filled 2024-08-02 – 2024-11-21 (×2): qty 30, 30d supply, fill #0

## 2024-08-02 MED ORDER — AMPHETAMINE-DEXTROAMPHET ER 20 MG PO CP24
40.0000 mg | ORAL_CAPSULE | ORAL | 0 refills | Status: DC
  Filled 2024-08-02 – 2024-11-21 (×2): qty 60, 30d supply, fill #0

## 2024-08-07 ENCOUNTER — Encounter: Payer: Self-pay | Admitting: Family

## 2024-08-08 ENCOUNTER — Ambulatory Visit (INDEPENDENT_AMBULATORY_CARE_PROVIDER_SITE_OTHER): Admitting: Family

## 2024-08-08 VITALS — BP 160/95 | HR 86 | Temp 98.6°F | Ht 64.0 in | Wt 211.4 lb

## 2024-08-08 DIAGNOSIS — R7303 Prediabetes: Secondary | ICD-10-CM

## 2024-08-08 DIAGNOSIS — I10 Essential (primary) hypertension: Secondary | ICD-10-CM

## 2024-08-08 DIAGNOSIS — Z0000 Encounter for general adult medical examination without abnormal findings: Secondary | ICD-10-CM | POA: Diagnosis not present

## 2024-08-08 DIAGNOSIS — N921 Excessive and frequent menstruation with irregular cycle: Secondary | ICD-10-CM | POA: Diagnosis not present

## 2024-08-08 NOTE — Patient Instructions (Signed)
 Referral back to GYN to ensure follow up with regard to endometrial polyp.   Health Maintenance, Female Adopting a healthy lifestyle and getting preventive care are important in promoting health and wellness. Ask your health care provider about: The right schedule for you to have regular tests and exams. Things you can do on your own to prevent diseases and keep yourself healthy. What should I know about diet, weight, and exercise? Eat a healthy diet  Eat a diet that includes plenty of vegetables, fruits, low-fat dairy products, and lean protein. Do not eat a lot of foods that are high in solid fats, added sugars, or sodium. Maintain a healthy weight Body mass index (BMI) is used to identify weight problems. It estimates body fat based on height and weight. Your health care provider can help determine your BMI and help you achieve or maintain a healthy weight. Get regular exercise Get regular exercise. This is one of the most important things you can do for your health. Most adults should: Exercise for at least 150 minutes each week. The exercise should increase your heart rate and make you sweat (moderate-intensity exercise). Do strengthening exercises at least twice a week. This is in addition to the moderate-intensity exercise. Spend less time sitting. Even light physical activity can be beneficial. Watch cholesterol and blood lipids Have your blood tested for lipids and cholesterol at 39 years of age, then have this test every 5 years. Have your cholesterol levels checked more often if: Your lipid or cholesterol levels are high. You are older than 39 years of age. You are at high risk for heart disease. What should I know about cancer screening? Depending on your health history and family history, you may need to have cancer screening at various ages. This may include screening for: Breast cancer. Cervical cancer. Colorectal cancer. Skin cancer. Lung cancer. What should I know  about heart disease, diabetes, and high blood pressure? Blood pressure and heart disease High blood pressure causes heart disease and increases the risk of stroke. This is more likely to develop in people who have high blood pressure readings or are overweight. Have your blood pressure checked: Every 3-5 years if you are 10-82 years of age. Every year if you are 34 years old or older. Diabetes Have regular diabetes screenings. This checks your fasting blood sugar level. Have the screening done: Once every three years after age 71 if you are at a normal weight and have a low risk for diabetes. More often and at a younger age if you are overweight or have a high risk for diabetes. What should I know about preventing infection? Hepatitis B If you have a higher risk for hepatitis B, you should be screened for this virus. Talk with your health care provider to find out if you are at risk for hepatitis B infection. Hepatitis C Testing is recommended for: Everyone born from [birth year] through 1966. Anyone with known risk factors for hepatitis C. Sexually transmitted infections (STIs) Get screened for STIs, including gonorrhea and chlamydia, if: You are sexually active and are younger than 39 years of age. You are older than 39 years of age and your health care provider tells you that you are at risk for this type of infection. Your sexual activity has changed since you were last screened, and you are at increased risk for chlamydia or gonorrhea. Ask your health care provider if you are at risk. Ask your health care provider about whether you are at high risk  for HIV. Your health care provider may recommend a prescription medicine to help prevent HIV infection. If you choose to take medicine to prevent HIV, you should first get tested for HIV. You should then be tested every 3 months for as long as you are taking the medicine. Pregnancy If you are about to stop having your period (premenopausal) and you  may become pregnant, seek counseling before you get pregnant. Take 400 to 800 micrograms (mcg) of folic acid  every day if you become pregnant. Ask for birth control (contraception) if you want to prevent pregnancy. Osteoporosis and menopause Osteoporosis is a disease in which the bones lose minerals and strength with aging. This can result in bone fractures. If you are 13 years old or older, or if you are at risk for osteoporosis and fractures, ask your health care provider if you should: Be screened for bone loss. Take a calcium or vitamin D  supplement to lower your risk of fractures. Be given hormone replacement therapy (HRT) to treat symptoms of menopause. Follow these instructions at home: Alcohol use Do not drink alcohol if: Your health care provider tells you not to drink. You are pregnant, may be pregnant, or are planning to become pregnant. If you drink alcohol: Limit how much you have to: 0-1 drink a day. Know how much alcohol is in your drink. In the [place] one drink equals one 12 oz bottle of beer (355 mL), one 5 oz glass of wine (148 mL), or one 1 oz glass of hard liquor (44 mL). Lifestyle Do not use any products that contain nicotine or tobacco. These products include cigarettes, chewing tobacco, and vaping devices, such as e-cigarettes. If you need help quitting, ask your health care provider. Do not use street drugs. Do not share needles. Ask your health care provider for help if you need support or information about quitting drugs. General instructions Schedule regular health, dental, and eye exams. Stay current with your vaccines. Tell your health care provider if: You often feel depressed. You have ever been abused or do not feel safe at home. Summary Adopting a healthy lifestyle and getting preventive care are important in promoting health and wellness. Follow your health care provider's instructions about healthy diet, exercising, and getting tested or screened for  diseases. Follow your health care provider's instructions on monitoring your cholesterol and blood pressure. This information is not intended to replace advice given to you by your health care provider. Make sure you discuss any questions you have with your health care provider. Document Revised: 03/31/2021 Document Reviewed: 03/31/2021 Elsevier Patient Education  2024 [employer].

## 2024-08-08 NOTE — Progress Notes (Unsigned)
 Assessment & Plan:  Routine physical examination Assessment & Plan: Clinical breast exam performed today.  Deferred pelvic exam in the absence of complaints and Pap smear is up-to-date.  Patient intends to follow-up with GYN to discuss endometrial polyp.  Encouraged regular exercise.   Menorrhagia with irregular cycle -     Ambulatory referral to Obstetrics / Gynecology -     CBC with Differential/Platelet; Future -     Iron, TIBC and Ferritin Panel; Future  Prediabetes -     Hemoglobin A1c; Future  Hypertension, unspecified type Assessment & Plan: Readings at home and work under excellent control.  We jointly agreed to defer medication changes today.  Continue losartan  25 mg daily, metoprolol  50 mg daily      Return precautions given.   Risks, benefits, and alternatives of the medications and treatment plan prescribed today were discussed, and patient expressed understanding.   Education regarding symptom management and diagnosis given to patient on AVS either electronically or printed.  Return in about 3 months (around 11/07/2024).  [name], FNP  Subjective:    Patient ID: Megan Tucker, female    DOB: 1986-08-31, 38 y.o.   MRN: [id number]  CC: Megan Tucker is a 38 y.o. female who presents today for physical exam.    HPI: Feels well today.  No new complaints   She took her medication 'right before I came here'.  She endorses feeling anxious about her blood pressure when he comes to doctors office.  She is quite pleased with blood pressure when checked at home and at work is consistently less than 120/80. Denies CP, SOB.    She is compliant with losartan  25 mg daily, metoprolol  50 mg daily.    Menses are heavy , improved from prior, 3 days. Menses are monthly , reguarly timed.   Menstrual cramps are less severe  Last seen 03/05/2022   Denies rectal bleeding, hematuria.   Hbg 11.4  8 [place] No early family history of breast or colon cancer Cervical  Cancer Screening: UTD, 03/05/22  NILM, neg HPV        Tetanus - due       Hepatitis C screening - Candidate for; declines  Exercise: Gets regular exercise, working and with taking care of children.   Alcohol use:  occassional Smoking/tobacco use: former smoker, quit 2015  Health Maintenance  Topic Date Due   DTaP/Tdap/Td vaccine (1 - Tdap) Never done   COVID-19 Vaccine (2 - 2025-26 season) 07/24/2024   Flu Shot  02/20/2025*   Pap with HPV screening  03/06/2027   HIV Screening  Completed   Pneumococcal Vaccine  Aged Out   Meningitis B Vaccine  Aged Out   Hepatitis B Vaccine  Discontinued   HPV Vaccine  Discontinued   Hepatitis C Screening  Discontinued  *Topic was postponed. The date shown is not the original due date.    ALLERGIES: Amlodipine  and Adhesive [tape]  Current Outpatient Medications on File Prior to Visit  Medication Sig Dispense Refill   amphetamine -dextroamphetamine  (ADDERALL  XR) 20 MG 24 hr capsule Take 2 capsules (40 mg total) by mouth every morning. 60 capsule 0   amphetamine -dextroamphetamine  (ADDERALL  XR) 20 MG 24 hr capsule Take 2 capsules (40 mg total) by mouth every morning. 60 capsule 0   amphetamine -dextroamphetamine  (ADDERALL  XR) 20 MG 24 hr capsule Take 2 capsules (40 mg total) by mouth every morning. 60 capsule 0   amphetamine -dextroamphetamine  (ADDERALL  XR) 20 MG 24 hr capsule Take 2  capsules (40 mg total) by mouth every morning. 60 capsule 0   amphetamine -dextroamphetamine  (ADDERALL  XR) 20 MG 24 hr capsule Take 2 capsules (40 mg total) by mouth every morning. 60 capsule 0   amphetamine -dextroamphetamine  (ADDERALL ) 5 MG tablet Take 1 tablet (5 mg total) by mouth daily. 30 tablet 0   amphetamine -dextroamphetamine  (ADDERALL ) 5 MG tablet Take 1 tablet (5 mg total) by mouth daily. 30 tablet 0   amphetamine -dextroamphetamine  (ADDERALL ) 5 MG tablet Take 1 tablet (5 mg total) by mouth daily. 30 tablet 0   amphetamine -dextroamphetamine  (ADDERALL ) 5 MG tablet  Take 1 tablet (5 mg total) by mouth daily. 30 tablet 0   amphetamine -dextroamphetamine  (ADDERALL ) 5 MG tablet Take 1 tablet (5 mg total) by mouth daily. 30 tablet 0   hydrOXYzine  (ATARAX ) 10 MG tablet Take 1 tablet (10 mg total) by mouth 2 (two) times daily as needed for anxiety. 30 tablet 2   losartan  (COZAAR ) 25 MG tablet Take 1 tablet (25 mg total) by mouth daily. 90 tablet 3   metoprolol  succinate (TOPROL -XL) 50 MG 24 hr tablet Take 1 tablet (50 mg total) by mouth daily take with or immediately following a meal. 90 tablet 3   No current facility-administered medications on file prior to visit.    Review of Systems  Constitutional:  Negative for chills and fever.  Respiratory:  Negative for cough.   Cardiovascular:  Negative for chest pain and palpitations.  Gastrointestinal:  Negative for nausea and vomiting.      Objective:    BP (!) 160/95   Pulse 86   Temp 98.6 F (37 C) (Oral)   Ht 5' 4 (1.626 m)   Wt 211 lb 6.4 oz (95.9 kg)   LMP 08/08/2024 (Exact Date)   SpO2 99%   BMI 36.29 kg/m   BP Readings from Last 3 Encounters:  08/08/24 (!) 160/95  01/20/24 120/80  08/30/23 128/82   Wt Readings from Last 3 Encounters:  08/08/24 211 lb 6.4 oz (95.9 kg)  01/20/24 199 lb (90.3 kg)  08/30/23 195 lb 9.6 oz (88.7 kg)    Physical Exam Vitals reviewed.  Constitutional:      Appearance: Normal appearance. She is well-developed.  Eyes:     Conjunctiva/sclera: Conjunctivae normal.  Neck:     Thyroid : No thyroid  mass or thyromegaly.  Cardiovascular:     Rate and Rhythm: Normal rate and regular rhythm.     Pulses: Normal pulses.     Heart sounds: Normal heart sounds.  Pulmonary:     Effort: Pulmonary effort is normal.     Breath sounds: Normal breath sounds. No wheezing, rhonchi or rales.  Chest:  Breasts:    Breasts are symmetrical.     Right: No inverted nipple, mass, nipple discharge, skin change or tenderness.     Left: No inverted nipple, mass, nipple discharge,  skin change or tenderness.  Abdominal:     General: Bowel sounds are normal. There is no distension.     Palpations: Abdomen is soft. Abdomen is not rigid. There is no fluid wave or mass.     Tenderness: There is no abdominal tenderness. There is no guarding or rebound.  Lymphadenopathy:     Head:     Right side of head: No submental, submandibular, tonsillar, preauricular, posterior auricular or occipital adenopathy.     Left side of head: No submental, submandibular, tonsillar, preauricular, posterior auricular or occipital adenopathy.     Cervical: No cervical adenopathy.     Right cervical:  No superficial, deep or posterior cervical adenopathy.    Left cervical: No superficial, deep or posterior cervical adenopathy.  Skin:    General: Skin is warm and dry.  Neurological:     Mental Status: She is alert.  Psychiatric:        Speech: Speech normal.        Behavior: Behavior normal.        Thought Content: Thought content normal.

## 2024-08-09 NOTE — Assessment & Plan Note (Signed)
 Clinical breast exam performed today.  Deferred pelvic exam in the absence of complaints and Pap smear is up-to-date.  Patient intends to follow-up with GYN to discuss endometrial polyp.  Encouraged regular exercise.

## 2024-08-09 NOTE — Assessment & Plan Note (Signed)
 Readings at home and work under excellent control.  We jointly agreed to defer medication changes today.  Continue losartan  25 mg daily, metoprolol  50 mg daily

## 2024-08-13 ENCOUNTER — Encounter: Payer: Self-pay | Admitting: Family

## 2024-08-14 NOTE — Telephone Encounter (Signed)
 fyi

## 2024-09-07 ENCOUNTER — Other Ambulatory Visit: Payer: Self-pay

## 2024-09-07 MED FILL — Losartan Potassium Tab 25 MG: ORAL | 90 days supply | Qty: 90 | Fill #0 | Status: AC

## 2024-09-20 NOTE — Progress Notes (Deleted)
    GYNECOLOGY PROGRESS NOTE  Subjective:  PCP: Dineen Rollene MATSU, FNP  Patient ID: Megan Tucker, female    DOB: Feb 19, 1986, 38 y.o.   MRN: [id number]  HPI  Patient is a 38 y.o. G67P3003 female who presents for referral from her PCP to discuss menorrhagia with irregular cycles.  She has a long history pelvic pain, dysmenorrhea, irregular menses and heavy bleeding.  She was evaluated in 2020 by Dr. Connell and at that time she desired a hysterectomy, but due to COVID no elective surgeries were being performed.  She returned in 2023 following a TVUS showing an endometrial polyp, and the plan was to finally have the hysterectomy, but she did not return for her preop. She presents today  {Common ambulatory SmartLinks:19316}  Review of Systems {ros; complete:30496}   Objective:   There were no vitals taken for this visit. There is no height or weight on file to calculate BMI.  General appearance: {general exam:16600} Abdomen: {abdominal exam:16834} Pelvic: {pelvic exam:16852::cervix normal in appearance,external genitalia normal,no adnexal masses or tenderness,no cervical motion tenderness,rectovaginal septum normal,uterus normal size, shape, and consistency,vagina normal without discharge} Extremities: {extremity exam:5109} Neurologic: {neuro exam:17854}   Assessment/Plan:   No diagnosis found.   There are no diagnoses linked to this encounter.     Estil Mangle, DO [facility]

## 2024-09-26 ENCOUNTER — Ambulatory Visit: Admitting: Obstetrics

## 2024-09-26 DIAGNOSIS — N921 Excessive and frequent menstruation with irregular cycle: Secondary | ICD-10-CM

## 2024-09-26 DIAGNOSIS — N840 Polyp of corpus uteri: Secondary | ICD-10-CM

## 2024-10-09 ENCOUNTER — Other Ambulatory Visit: Payer: Self-pay

## 2024-10-09 ENCOUNTER — Other Ambulatory Visit: Payer: Self-pay | Admitting: Family

## 2024-10-09 DIAGNOSIS — F902 Attention-deficit hyperactivity disorder, combined type: Secondary | ICD-10-CM

## 2024-10-09 MED FILL — Hydroxyzine HCl Tab 10 MG: ORAL | 15 days supply | Qty: 30 | Fill #2 | Status: AC

## 2024-10-10 NOTE — Telephone Encounter (Signed)
 open in error

## 2024-10-13 NOTE — Progress Notes (Deleted)
    GYNECOLOGY PROGRESS NOTE  Subjective:  PCP: Dineen Rollene MATSU, FNP  Patient ID: Megan Tucker, female    DOB: 1986/09/05, 38 y.o.   MRN: [id number]  HPI  Patient is a 38 y.o. [id number] female who presents for referral from her PCP to discuss menorrhagia with irregular cycles.  She has a long history pelvic pain, dysmenorrhea, irregular menses and heavy bleeding.  She was evaluated in 2020 by Dr. Connell and at that time she desired a hysterectomy, but due to COVID no elective surgeries were being performed.  She returned in 2023 following a TVUS showing an endometrial polyp, and the plan was to finally have the hysterectomy, but she did not return for her preop. Last pap  03/05/2022 NILM HPV negative.  She presents today to discuss her heavy bleeding and endometrial polyp.  {Common ambulatory SmartLinks:19316}  Review of Systems {ros; complete:30496}   Objective:   There were no vitals taken for this visit. There is no height or weight on file to calculate BMI.  General appearance: {general exam:16600} Abdomen: {abdominal exam:16834} Pelvic: {pelvic exam:16852::cervix normal in appearance,external genitalia normal,no adnexal masses or tenderness,no cervical motion tenderness,rectovaginal septum normal,uterus normal size, shape, and consistency,vagina normal without discharge} Extremities: {extremity exam:5109} Neurologic: {neuro exam:17854}  Ultrasound 01/08/2022 Narrative & Impression  CLINICAL DATA:  Pelvic pain for 10 days   EXAM: TRANSABDOMINAL AND TRANSVAGINAL ULTRASOUND OF PELVIS   DOPPLER ULTRASOUND OF OVARIES   TECHNIQUE: Both transabdominal and transvaginal ultrasound examinations of the pelvis were performed. Transabdominal technique was performed for global imaging of the pelvis including uterus, ovaries, adnexal regions, and pelvic cul-de-sac.   It was necessary to proceed with endovaginal exam following the transabdominal exam to visualize the  endometrium and adnexal structures. Color and duplex Doppler ultrasound was utilized to evaluate blood flow to the ovaries.   COMPARISON:  01/08/2022, 12/28/2018   FINDINGS: Uterus   Measurements: 9.5 x 4.9 x 5.2 cm = volume: 128 mL. No fibroids or other mass visualized.   Endometrium   Thickness: 16 mm. 1.2 x 0.5 by 1.1 cm echogenic structure within the endometrial cavity consistent with polyp.   Right ovary   Measurements: 4.1 x 2.5 by 3.5 cm = volume: 19.2 mL. Multiple follicles are seen within the right ovary. Likely ruptured follicle or hemorrhagic cyst measuring 1.8 x 1.7 x 1.7 cm.   Left ovary   Measurements: 3.7 x 2.2 x 1.5 cm = volume: 6.3 mL. Normal appearance/no adnexal mass.   Pulsed Doppler evaluation of both ovaries demonstrates normal low-resistance arterial and venous waveforms.   Other findings   Small amount of free fluid within the causes act.   IMPRESSION: 1. Hemorrhagic cyst right ovary measuring 1.8 cm. 2. Small amount of free fluid within the lower pelvis. 3. 1.2 cm echogenic structure within the endometrial cavity, compatible with polyp. Further evaluation with sonohysterogram or hysteroscopy could be considered.     Electronically Signed   By: Ozell Daring M.D.   On: 01/08/2022 22:07   Assessment/Plan:   No diagnosis found.   There are no diagnoses linked to this encounter.     Estil Mangle, DO [facility]

## 2024-10-24 ENCOUNTER — Encounter: Admitting: Obstetrics

## 2024-10-24 DIAGNOSIS — N840 Polyp of corpus uteri: Secondary | ICD-10-CM

## 2024-10-24 DIAGNOSIS — N921 Excessive and frequent menstruation with irregular cycle: Secondary | ICD-10-CM

## 2024-10-31 NOTE — Progress Notes (Deleted)
   Assessment & Plan:  There are no diagnoses linked to this encounter.   Return precautions given.   Risks, benefits, and alternatives of the medications and treatment plan prescribed today were discussed, and patient expressed understanding.   Education regarding symptom management and diagnosis given to patient on AVS either electronically or printed.  No follow-ups on file.  [name], FNP  Subjective:    Patient ID: Saddie CHRISTELLA Croak, female    DOB: December 26, 1985, 38 y.o.   MRN: [id number]  CC: NIOMI VALENT is a 38 y.o. female who presents today for follow up.   HPI: HPI She is been contacted by GYN, she has not scheduled an appointment Allergies: Amlodipine  and Adhesive [tape] Current Outpatient Medications on File Prior to Visit  Medication Sig Dispense Refill   amphetamine -dextroamphetamine  (ADDERALL  XR) 20 MG 24 hr capsule Take 2 capsules (40 mg total) by mouth every morning. 60 capsule 0   amphetamine -dextroamphetamine  (ADDERALL  XR) 20 MG 24 hr capsule Take 2 capsules (40 mg total) by mouth every morning. 60 capsule 0   amphetamine -dextroamphetamine  (ADDERALL  XR) 20 MG 24 hr capsule Take 2 capsules (40 mg total) by mouth every morning. 60 capsule 0   amphetamine -dextroamphetamine  (ADDERALL  XR) 20 MG 24 hr capsule Take 2 capsules (40 mg total) by mouth every morning. 60 capsule 0   amphetamine -dextroamphetamine  (ADDERALL  XR) 20 MG 24 hr capsule Take 2 capsules (40 mg total) by mouth every morning. 60 capsule 0   amphetamine -dextroamphetamine  (ADDERALL ) 5 MG tablet Take 1 tablet (5 mg total) by mouth daily. 30 tablet 0   amphetamine -dextroamphetamine  (ADDERALL ) 5 MG tablet Take 1 tablet (5 mg total) by mouth daily. 30 tablet 0   amphetamine -dextroamphetamine  (ADDERALL ) 5 MG tablet Take 1 tablet (5 mg total) by mouth daily. 30 tablet 0   amphetamine -dextroamphetamine  (ADDERALL ) 5 MG tablet Take 1 tablet (5 mg total) by mouth daily. 30 tablet 0   amphetamine -dextroamphetamine   (ADDERALL ) 5 MG tablet Take 1 tablet (5 mg total) by mouth daily. 30 tablet 0   hydrOXYzine  (ATARAX ) 10 MG tablet Take 1 tablet (10 mg total) by mouth 2 (two) times daily as needed for anxiety. 30 tablet 2   losartan  (COZAAR ) 25 MG tablet Take 1 tablet (25 mg total) by mouth daily. 90 tablet 3   metoprolol  succinate (TOPROL -XL) 50 MG 24 hr tablet Take 1 tablet (50 mg total) by mouth daily take with or immediately following a meal. 90 tablet 3   No current facility-administered medications on file prior to visit.    Review of Systems    Objective:    There were no vitals taken for this visit. BP Readings from Last 3 Encounters:  08/08/24 (!) 160/95  01/20/24 120/80  08/30/23 128/82   Wt Readings from Last 3 Encounters:  08/08/24 211 lb 6.4 oz (95.9 kg)  01/20/24 199 lb (90.3 kg)  08/30/23 195 lb 9.6 oz (88.7 kg)    Physical Exam

## 2024-11-07 ENCOUNTER — Ambulatory Visit: Admitting: Family

## 2024-11-20 ENCOUNTER — Other Ambulatory Visit: Payer: Self-pay | Admitting: Family

## 2024-11-20 DIAGNOSIS — F902 Attention-deficit hyperactivity disorder, combined type: Secondary | ICD-10-CM

## 2024-11-21 ENCOUNTER — Other Ambulatory Visit: Payer: Self-pay

## 2024-11-21 NOTE — Telephone Encounter (Signed)
 Refilled: 20 mg  08/02/2024    5 mg  08/02/2024 Last OV: 08/08/2024 Next OV: 12/01/2024

## 2024-12-01 ENCOUNTER — Ambulatory Visit: Admitting: Family

## 2024-12-19 ENCOUNTER — Ambulatory Visit: Admitting: Family

## 2024-12-20 ENCOUNTER — Other Ambulatory Visit: Payer: Self-pay

## 2024-12-20 ENCOUNTER — Other Ambulatory Visit: Payer: Self-pay | Admitting: Family

## 2024-12-20 DIAGNOSIS — F902 Attention-deficit hyperactivity disorder, combined type: Secondary | ICD-10-CM

## 2024-12-20 MED FILL — Losartan Potassium Tab 25 MG: ORAL | 90 days supply | Qty: 90 | Fill #1 | Status: AC

## 2024-12-21 ENCOUNTER — Other Ambulatory Visit: Payer: Self-pay

## 2024-12-21 MED ORDER — AMPHETAMINE-DEXTROAMPHET ER 20 MG PO CP24
40.0000 mg | ORAL_CAPSULE | Freq: Every day | ORAL | 0 refills | Status: AC
  Filled 2024-12-21: qty 60, 30d supply, fill #0

## 2024-12-21 MED ORDER — AMPHETAMINE-DEXTROAMPHETAMINE 5 MG PO TABS
5.0000 mg | ORAL_TABLET | Freq: Every day | ORAL | 0 refills | Status: AC
  Filled 2024-12-21: qty 30, 30d supply, fill #0

## 2024-12-21 MED ORDER — AMPHETAMINE-DEXTROAMPHET ER 20 MG PO CP24
40.0000 mg | ORAL_CAPSULE | ORAL | 0 refills | Status: AC
  Filled 2024-12-21 (×2): qty 60, 30d supply, fill #0

## 2024-12-21 MED ORDER — AMPHETAMINE-DEXTROAMPHET ER 20 MG PO CP24
40.0000 mg | ORAL_CAPSULE | ORAL | 0 refills | Status: AC
  Filled 2024-12-21: qty 60, 30d supply, fill #0

## 2025-01-04 ENCOUNTER — Ambulatory Visit: Admitting: Family
